# Patient Record
Sex: Male | Born: 1960 | Race: Black or African American | Hispanic: No | Marital: Single | Smoking: Never smoker
Health system: Southern US, Community
[De-identification: ages and names within clinical notes are randomized; demographics above are authoritative.]

## PROBLEM LIST (undated history)

## (undated) DIAGNOSIS — M1990 Unspecified osteoarthritis, unspecified site: Secondary | ICD-10-CM

## (undated) DIAGNOSIS — K219 Gastro-esophageal reflux disease without esophagitis: Secondary | ICD-10-CM

## (undated) DIAGNOSIS — N200 Calculus of kidney: Secondary | ICD-10-CM

## (undated) DIAGNOSIS — M419 Scoliosis, unspecified: Secondary | ICD-10-CM

## (undated) DIAGNOSIS — G709 Myoneural disorder, unspecified: Secondary | ICD-10-CM

## (undated) HISTORY — DX: Myoneural disorder, unspecified: G70.9

## (undated) HISTORY — PX: COLONOSCOPY: SHX174

## (undated) HISTORY — DX: Scoliosis, unspecified: M41.9

## (undated) HISTORY — DX: Calculus of kidney: N20.0

## (undated) HISTORY — DX: Unspecified osteoarthritis, unspecified site: M19.90

---

## 1998-06-23 ENCOUNTER — Emergency Department (HOSPITAL_COMMUNITY): Admission: EM | Admit: 1998-06-23 | Discharge: 1998-06-23 | Payer: Self-pay | Admitting: Emergency Medicine

## 2016-01-08 ENCOUNTER — Encounter (HOSPITAL_COMMUNITY): Payer: Self-pay | Admitting: Emergency Medicine

## 2016-01-08 ENCOUNTER — Emergency Department (HOSPITAL_COMMUNITY)
Admission: EM | Admit: 2016-01-08 | Discharge: 2016-01-09 | Disposition: A | Discharge status: Home / Self Care | Payer: Non-veteran care | Attending: Emergency Medicine | Admitting: Emergency Medicine

## 2016-01-08 DIAGNOSIS — R112 Nausea with vomiting, unspecified: Secondary | ICD-10-CM | POA: Diagnosis not present

## 2016-01-08 DIAGNOSIS — Z79899 Other long term (current) drug therapy: Secondary | ICD-10-CM | POA: Diagnosis not present

## 2016-01-08 DIAGNOSIS — R531 Weakness: Secondary | ICD-10-CM | POA: Insufficient documentation

## 2016-01-08 DIAGNOSIS — R509 Fever, unspecified: Secondary | ICD-10-CM | POA: Diagnosis not present

## 2016-01-08 DIAGNOSIS — R1013 Epigastric pain: Secondary | ICD-10-CM | POA: Diagnosis present

## 2016-01-08 DIAGNOSIS — R05 Cough: Secondary | ICD-10-CM | POA: Insufficient documentation

## 2016-01-08 HISTORY — DX: Gastro-esophageal reflux disease without esophagitis: K21.9

## 2016-01-08 LAB — LIPASE, BLOOD: Lipase: 29 U/L (ref 11–51)

## 2016-01-08 LAB — COMPREHENSIVE METABOLIC PANEL
ALBUMIN: 4.9 g/dL (ref 3.5–5.0)
ALK PHOS: 85 U/L (ref 38–126)
ALT: 18 U/L (ref 17–63)
AST: 25 U/L (ref 15–41)
Anion gap: 11 (ref 5–15)
BILIRUBIN TOTAL: 1.6 mg/dL — AB (ref 0.3–1.2)
BUN: 28 mg/dL — AB (ref 6–20)
CO2: 25 mmol/L (ref 22–32)
CREATININE: 1.44 mg/dL — AB (ref 0.61–1.24)
Calcium: 10.3 mg/dL (ref 8.9–10.3)
Chloride: 106 mmol/L (ref 101–111)
GFR calc Af Amer: >60 mL/min (ref >60)
GFR, EST NON AFRICAN AMERICAN: 54 mL/min — AB (ref >60)
GLUCOSE: 139 mg/dL — AB (ref 65–99)
POTASSIUM: 3.6 mmol/L (ref 3.5–5.1)
Sodium: 142 mmol/L (ref 135–145)
TOTAL PROTEIN: 9.4 g/dL — AB (ref 6.5–8.1)

## 2016-01-08 LAB — CBC
HEMATOCRIT: 45.1 % (ref 39.0–52.0)
Hemoglobin: 15.4 g/dL (ref 13.0–17.0)
MCH: 29.6 pg (ref 26.0–34.0)
MCHC: 34.1 g/dL (ref 30.0–36.0)
MCV: 86.6 fL (ref 78.0–100.0)
PLATELETS: 316 10*3/uL (ref 150–400)
RBC: 5.21 MIL/uL (ref 4.22–5.81)
RDW: 14.3 % (ref 11.5–15.5)
WBC: 10.4 10*3/uL (ref 4.0–10.5)

## 2016-01-08 MED ORDER — DIPHENHYDRAMINE HCL 50 MG/ML IJ SOLN
25.0000 mg | Freq: Once | INTRAMUSCULAR | Status: AC
  Administered 2016-01-08: 25 mg via INTRAVENOUS
  Filled 2016-01-08: qty 1

## 2016-01-08 MED ORDER — SODIUM CHLORIDE 0.9 % IV BOLUS (SEPSIS)
1000.0000 mL | Freq: Once | INTRAVENOUS | Status: AC
  Administered 2016-01-08: 1000 mL via INTRAVENOUS

## 2016-01-08 MED ORDER — METOCLOPRAMIDE HCL 5 MG/ML IJ SOLN
10.0000 mg | Freq: Once | INTRAMUSCULAR | Status: AC
  Administered 2016-01-08: 10 mg via INTRAVENOUS
  Filled 2016-01-08: qty 2

## 2016-01-08 NOTE — ED Provider Notes (Signed)
[place] DEPT Provider Note   CSN: [id number] Arrival date & time: 01/08/16  2108  By signing my name below, I, Higinio Plan, attest that this documentation has been prepared under the direction and in the presence of Orpah Greek, MD . Electronically Signed: Higinio Plan, Scribe. 01/08/2016. 11:34 PM.  History   Chief Complaint Chief Complaint  Patient presents with  . Abdominal Pain  . Emesis   The history is provided by the patient. No language interpreter was used.   HPI Comments: Eric Farrell is a 55 y.o. male with PMHx of GERD, who presents to the Emergency Department complaining of gradually worsening, constant, epigastric abdominal pain with associated nausea, vomiting and dry cough that began 2 days ago. Per friend, pt was "shivering" earlier today and complaining of generalized "weakness." Pt is nauseous now in the ED. He reports similar symptoms in the past in which he was diagnosed with GERD. He denies fever, diarrhea and PSHx to his abdomen.    Past Medical History:  Diagnosis Date  . GERD (gastroesophageal reflux disease)    There are no active problems to display for this patient.  History reviewed. No pertinent surgical history.   Home Medications    Prior to Admission medications   Medication Sig Start Date End Date Taking? Authorizing Provider  omeprazole (PRILOSEC) 40 MG capsule Take 40 mg by mouth daily.   Yes Historical Provider, MD    Family History Family History  Problem Relation Age of Onset  . Cancer Father   . Hypertension Other     Social History Social History  Substance Use Topics  . Smoking status: Never Smoker  . Smokeless tobacco: Never Used  . Alcohol use No    Allergies   Review of patient's allergies indicates no known allergies.  Review of Systems Review of Systems  Constitutional: Positive for chills and fever.  Respiratory: Positive for cough.   Gastrointestinal: Positive for abdominal pain, nausea and vomiting.  Negative for diarrhea.  Neurological: Positive for weakness.   Physical Exam Updated Vital Signs BP (!) 134/102 (BP Location: Left Arm)   Pulse 62   Temp 98.5 F (36.9 C) (Oral)   Resp 18   Ht 6' (1.829 m)   Wt 170 lb (77.1 kg)   SpO2 100%   BMI 23.06 kg/m   Physical Exam  Constitutional: He is oriented to person, place, and time. He appears well-developed and well-nourished. No distress.  HENT:  Head: Normocephalic and atraumatic.  Right Ear: Hearing normal.  Left Ear: Hearing normal.  Nose: Nose normal.  Mouth/Throat: Oropharynx is clear and moist and mucous membranes are normal.  Mucous membranes dry  Eyes: Conjunctivae and EOM are normal. Pupils are equal, round, and reactive to light.  Neck: Normal range of motion. Neck supple.  Cardiovascular: Regular rhythm, S1 normal and S2 normal.  Exam reveals no gallop and no friction rub.   No murmur heard. Pulmonary/Chest: Effort normal and breath sounds normal. No respiratory distress. He exhibits no tenderness.  Abdominal: Soft. Normal appearance and bowel sounds are normal. There is no hepatosplenomegaly. There is tenderness. There is no rebound, no guarding, no tenderness at McBurney's point and negative Murphy's sign. No hernia.  Epigastric tenderness without guarding or rebound   Musculoskeletal: Normal range of motion.  Neurological: He is alert and oriented to person, place, and time. He has normal strength. No cranial nerve deficit or sensory deficit. Coordination normal. GCS eye subscore is 4. GCS verbal subscore is 5. GCS  motor subscore is 6.  Skin: Skin is warm, dry and intact. No rash noted. No cyanosis.  Psychiatric: He has a normal mood and affect. His speech is normal and behavior is normal. Thought content normal.  Nursing note and vitals reviewed.  ED Treatments / Results  Labs (all labs ordered are listed, but only abnormal results are displayed) Labs Reviewed  CBC  LIPASE, BLOOD  COMPREHENSIVE METABOLIC  PANEL  URINALYSIS, ROUTINE W REFLEX MICROSCOPIC (NOT AT [facility])    EKG  EKG Interpretation None       Radiology No results found.  Procedures Procedures  DIAGNOSTIC STUDIES:  Oxygen Saturation is 100% on RA, normal by my interpretation.    COORDINATION OF CARE:  11:19 PM Discussed treatment plan with pt at bedside and pt agreed to plan.  Medications Ordered in ED Medications - No data to display  Initial Impression / Assessment and Plan / ED Course  I have reviewed the triage vital signs and the nursing notes.  Pertinent labs & imaging results that were available during my care of the patient were reviewed by me and considered in my medical decision making (see chart for details).  Clinical Course    Patient presents to the ER for evaluation of nausea and vomiting with upper abdominal pain. Symptoms ongoing for 2 days. He has had some common, cough. Lungs are clear. Patient did appear clinically very dehydrated on arrival. He was treated with antiemetics and IV fluids. He is feeling better after aggressive fluid hydration. Abdominal exam is benign, lab work otherwise unremarkable.  I personally performed the services described in this documentation, which was scribed in my presence. The recorded information has been reviewed and is accurate.   Final Clinical Impressions(s) / ED Diagnoses   Final diagnoses:  None  vomiting abd pain  New Prescriptions New Prescriptions   No medications on file     Orpah Greek, MD 01/09/16 [phone]

## 2016-01-08 NOTE — ED Triage Notes (Signed)
Pt states he has abd pain with nausea and vomiting for 2 days  Pt states he feels weak and dehydrated

## 2016-01-09 LAB — URINALYSIS, ROUTINE W REFLEX MICROSCOPIC
GLUCOSE, UA: NEGATIVE mg/dL
HGB URINE DIPSTICK: NEGATIVE
KETONES UR: 15 mg/dL — AB
Leukocytes, UA: NEGATIVE
Nitrite: NEGATIVE
PROTEIN: 30 mg/dL — AB
Specific Gravity, Urine: 1.034 — ABNORMAL HIGH (ref 1.005–1.030)
pH: 5 (ref 5.0–8.0)

## 2016-01-09 LAB — URINE MICROSCOPIC-ADD ON
RBC / HPF: NONE SEEN RBC/hpf (ref 0–5)
Squamous Epithelial / LPF: NONE SEEN

## 2016-01-09 MED ORDER — PROMETHAZINE HCL 25 MG PO TABS: 25.0000 mg | ORAL_TABLET | Freq: Four times a day (QID) | ORAL | 0 refills | Status: DC | PRN

## 2016-01-09 MED ORDER — SODIUM CHLORIDE 0.9 % IV BOLUS (SEPSIS)
1000.0000 mL | Freq: Once | INTRAVENOUS | Status: AC
  Administered 2016-01-09: 1000 mL via INTRAVENOUS

## 2017-04-05 ENCOUNTER — Encounter (HOSPITAL_COMMUNITY): Payer: Self-pay | Admitting: Emergency Medicine

## 2017-04-05 ENCOUNTER — Other Ambulatory Visit: Payer: Self-pay

## 2017-04-05 ENCOUNTER — Emergency Department (HOSPITAL_COMMUNITY)
Admission: EM | Admit: 2017-04-05 | Discharge: 2017-04-05 | Disposition: A | Discharge status: Home / Self Care | Payer: Non-veteran care | Attending: Emergency Medicine | Admitting: Emergency Medicine

## 2017-04-05 DIAGNOSIS — R112 Nausea with vomiting, unspecified: Secondary | ICD-10-CM | POA: Diagnosis present

## 2017-04-05 LAB — CBC
HCT: 44.6 % (ref 39.0–52.0)
Hemoglobin: 15.1 g/dL (ref 13.0–17.0)
MCH: 29.5 pg (ref 26.0–34.0)
MCHC: 33.9 g/dL (ref 30.0–36.0)
MCV: 87.1 fL (ref 78.0–100.0)
Platelets: 270 10*3/uL (ref 150–400)
RBC: 5.12 MIL/uL (ref 4.22–5.81)
RDW: 15.1 % (ref 11.5–15.5)
WBC: 10.2 10*3/uL (ref 4.0–10.5)

## 2017-04-05 LAB — COMPREHENSIVE METABOLIC PANEL WITH GFR
ALT: 28 U/L (ref 17–63)
AST: 33 U/L (ref 15–41)
Albumin: 3.5 g/dL (ref 3.5–5.0)
Alkaline Phosphatase: 57 U/L (ref 38–126)
Anion gap: 9 (ref 5–15)
BUN: 17 mg/dL (ref 6–20)
CO2: 27 mmol/L (ref 22–32)
Calcium: 9 mg/dL (ref 8.9–10.3)
Chloride: 106 mmol/L (ref 101–111)
Creatinine, Ser: 1.27 mg/dL — ABNORMAL HIGH (ref 0.61–1.24)
GFR calc Af Amer: >60 mL/min
GFR calc non Af Amer: >60 mL/min
Glucose, Bld: 129 mg/dL — ABNORMAL HIGH (ref 65–99)
Potassium: 3.5 mmol/L (ref 3.5–5.1)
Sodium: 142 mmol/L (ref 135–145)
Total Bilirubin: 1.7 mg/dL — ABNORMAL HIGH (ref 0.3–1.2)
Total Protein: 6.7 g/dL (ref 6.5–8.1)

## 2017-04-05 LAB — LIPASE, BLOOD: Lipase: 20 U/L (ref 11–51)

## 2017-04-05 MED ORDER — METOCLOPRAMIDE HCL 5 MG/ML IJ SOLN
10.0000 mg | Freq: Once | INTRAMUSCULAR | Status: AC
  Administered 2017-04-05: 10 mg via INTRAVENOUS
  Filled 2017-04-05: qty 2

## 2017-04-05 MED ORDER — GI COCKTAIL ~~LOC~~
30.0000 mL | Freq: Once | ORAL | Status: AC
  Administered 2017-04-05: 30 mL via ORAL
  Filled 2017-04-05: qty 30

## 2017-04-05 MED ORDER — SODIUM CHLORIDE 0.9 % IV BOLUS (SEPSIS)
1000.0000 mL | Freq: Once | INTRAVENOUS | Status: AC
  Administered 2017-04-05: 1000 mL via INTRAVENOUS

## 2017-04-05 MED ORDER — ONDANSETRON 4 MG PO TBDP
4.0000 mg | ORAL_TABLET | Freq: Once | ORAL | Status: AC | PRN
  Administered 2017-04-05: 4 mg via ORAL
  Filled 2017-04-05: qty 1

## 2017-04-05 MED ORDER — PANTOPRAZOLE SODIUM 40 MG IV SOLR
40.0000 mg | Freq: Once | INTRAVENOUS | Status: AC
  Administered 2017-04-05: 40 mg via INTRAVENOUS
  Filled 2017-04-05: qty 40

## 2017-04-05 MED ORDER — DIPHENHYDRAMINE HCL 50 MG/ML IJ SOLN
25.0000 mg | Freq: Once | INTRAMUSCULAR | Status: AC
  Administered 2017-04-05: 25 mg via INTRAVENOUS
  Filled 2017-04-05: qty 1

## 2017-04-05 NOTE — ED Notes (Signed)
Pt present alert and oriented  x 4 and is verbally responsive. Pt reports that he has been having nausea and vomiting x 2 days ,and states that he has coffee like grounds emesis. Pt also reports burning sensation in his esophagus described as like reflux. Pt does reports that he has associated generalized weakness.

## 2017-04-05 NOTE — ED Triage Notes (Signed)
Patient c/o abd pain "like acid reflux real bad" with n/v x 2 days. Reports that he had this happen before and had to get IV fluids.

## 2017-04-05 NOTE — ED Notes (Signed)
Patient is aware of needing a urine sample. Provided a urinal.

## 2017-04-05 NOTE — ED Provider Notes (Signed)
[place] DEPT Provider Note   CSN: [id number] Arrival date & time: 04/05/17  1500     History   Chief Complaint Chief Complaint  Patient presents with  . Emesis    HPI Eric Farrell is a 56 y.o. male.  The history is provided by the patient. No language interpreter was used.  Emesis     Eric Farrell is a 56 y.o. male who presents to the Emergency Department complaining of N/V.  He reports 2 days of nausea and vomiting.  He initially was vomiting food and today appears to be coffee grounds.  He reports generalized abdominal soreness that started today after multiple episodes of emesis.  He is throwing up 5-6 times daily.  No fevers, cough, chest pain, diarrhea.  He is supposed to be on medications for his stomach but is not sure what they are.  He is followed by the [facility] with gastroenterology.  He has a history of similar symptoms in the past.  He denies any alcohol use but does use marijuana occasionally.  Symptoms are moderate, constant, worsening. Past Medical History:  Diagnosis Date  . GERD (gastroesophageal reflux disease)     There are no active problems to display for this patient.   History reviewed. No pertinent surgical history.     Home Medications    Prior to Admission medications   Medication Sig Start Date End Date Taking? Authorizing Provider  promethazine (PHENERGAN) 25 MG tablet Take 1 tablet (25 mg total) by mouth every 6 (six) hours as needed for nausea or vomiting. 01/09/16  Yes Pollina, Gwenyth Allegra, MD    Family History Family History  Problem Relation Age of Onset  . Cancer Father   . Hypertension Other     Social History Social History   Tobacco Use  . Smoking status: Never Smoker  . Smokeless tobacco: Never Used  Substance Use Topics  . Alcohol use: No  . Drug use: No     Allergies   Patient has no known allergies.   Review of Systems Review of Systems  Gastrointestinal: Positive for  vomiting.  All other systems reviewed and are negative.    Physical Exam Updated Vital Signs BP 114/79 (BP Location: Left Arm)   Pulse 63   Temp 98.9 F (37.2 C) (Oral)   Resp 18   Ht 6' (1.829 m)   Wt 68.5 kg (151 lb)   SpO2 97%   BMI 20.48 kg/m   Physical Exam  Constitutional: He is oriented to person, place, and time. He appears well-developed and well-nourished.  HENT:  Head: Normocephalic and atraumatic.  Cardiovascular: Normal rate and regular rhythm.  No murmur heard. Pulmonary/Chest: Effort normal and breath sounds normal. No respiratory distress.  Abdominal: Soft. There is no rebound and no guarding.  Mild generalized abdominal tenderness  Musculoskeletal: He exhibits no edema or tenderness.  Neurological: He is alert and oriented to person, place, and time.  Skin: Skin is warm and dry.  Psychiatric: He has a normal mood and affect. His behavior is normal.  Nursing note and vitals reviewed.    ED Treatments / Results  Labs (all labs ordered are listed, but only abnormal results are displayed) Labs Reviewed  COMPREHENSIVE METABOLIC PANEL - Abnormal; Notable for the following components:      Result Value   Glucose, Bld 129 (*)    Creatinine, Ser 1.27 (*)    Total Bilirubin 1.7 (*)    All other components within normal  limits  LIPASE, BLOOD  CBC  URINALYSIS, ROUTINE W REFLEX MICROSCOPIC    EKG  EKG Interpretation None       Radiology No results found.  Procedures Procedures (including critical care time)  Medications Ordered in ED Medications  ondansetron (ZOFRAN-ODT) disintegrating tablet 4 mg (4 mg Oral Given 04/05/17 1518)  sodium chloride 0.9 % bolus 1,000 mL (0 mLs Intravenous Stopped 04/05/17 2036)  pantoprazole (PROTONIX) injection 40 mg (40 mg Intravenous Given 04/05/17 1949)  metoCLOPramide (REGLAN) injection 10 mg (10 mg Intravenous Given 04/05/17 1949)  diphenhydrAMINE (BENADRYL) injection 25 mg (25 mg Intravenous Given 04/05/17  1948)  sodium chloride 0.9 % bolus 1,000 mL (0 mLs Intravenous Stopped 04/05/17 2037)  gi cocktail (Maalox,Lidocaine,Donnatal) (30 mLs Oral Given 04/05/17 2055)     Initial Impression / Assessment and Plan / ED Course  I have reviewed the triage vital signs and the nursing notes.  Pertinent labs & imaging results that were available during my care of the patient were reviewed by me and considered in my medical decision making (see chart for details).     Patient here for evaluation of nausea and vomiting for the last 2 days.  He does report coffee-ground emesis today.  He was treated with IV fluids in the department and antiemetics.  On repeat assessment his symptoms are significantly improved and repeat abdominal examination is soft and nontender.  He has a history of multiple similar symptoms in the past and is followed by gastroenterology at the [facility].  He does have GI medications at home but he is unsure of the names.  There is no current evidence of major GI bleed, perforated viscus, bowel obstruction, acute abdomen.  Plan to DC home with outpatient follow-up as well as return precautions.  Final Clinical Impressions(s) / ED Diagnoses   Final diagnoses:  Non-intractable vomiting with nausea, unspecified vomiting type    ED Discharge Orders    None       Quintella Reichert, MD 04/05/17 2353

## 2017-04-07 ENCOUNTER — Encounter (HOSPITAL_COMMUNITY): Payer: Self-pay | Admitting: Emergency Medicine

## 2017-04-07 ENCOUNTER — Emergency Department (HOSPITAL_COMMUNITY)
Admission: EM | Admit: 2017-04-07 | Discharge: 2017-04-07 | Disposition: A | Discharge status: Home / Self Care | Payer: Self-pay | Attending: Emergency Medicine | Admitting: Emergency Medicine

## 2017-04-07 ENCOUNTER — Emergency Department (HOSPITAL_COMMUNITY): Payer: Self-pay

## 2017-04-07 DIAGNOSIS — R1013 Epigastric pain: Secondary | ICD-10-CM | POA: Insufficient documentation

## 2017-04-07 DIAGNOSIS — R112 Nausea with vomiting, unspecified: Secondary | ICD-10-CM | POA: Insufficient documentation

## 2017-04-07 LAB — COMPREHENSIVE METABOLIC PANEL
ALBUMIN: 4 g/dL (ref 3.5–5.0)
ALT: 25 U/L (ref 17–63)
ANION GAP: 7 (ref 5–15)
AST: 26 U/L (ref 15–41)
Alkaline Phosphatase: 59 U/L (ref 38–126)
BILIRUBIN TOTAL: 2.2 mg/dL — AB (ref 0.3–1.2)
BUN: 19 mg/dL (ref 6–20)
CO2: 28 mmol/L (ref 22–32)
Calcium: 9 mg/dL (ref 8.9–10.3)
Chloride: 107 mmol/L (ref 101–111)
Creatinine, Ser: 1.38 mg/dL — ABNORMAL HIGH (ref 0.61–1.24)
GFR calc Af Amer: >60 mL/min (ref >60)
GFR, EST NON AFRICAN AMERICAN: 57 mL/min — AB (ref >60)
GLUCOSE: 121 mg/dL — AB (ref 65–99)
POTASSIUM: 3.2 mmol/L — AB (ref 3.5–5.1)
Sodium: 142 mmol/L (ref 135–145)
TOTAL PROTEIN: 7.2 g/dL (ref 6.5–8.1)

## 2017-04-07 LAB — CBC
HCT: 43 % (ref 39.0–52.0)
Hemoglobin: 14.8 g/dL (ref 13.0–17.0)
MCH: 30.3 pg (ref 26.0–34.0)
MCHC: 34.4 g/dL (ref 30.0–36.0)
MCV: 88.1 fL (ref 78.0–100.0)
PLATELETS: 274 10*3/uL (ref 150–400)
RBC: 4.88 MIL/uL (ref 4.22–5.81)
RDW: 15 % (ref 11.5–15.5)
WBC: 9.7 10*3/uL (ref 4.0–10.5)

## 2017-04-07 LAB — URINALYSIS, ROUTINE W REFLEX MICROSCOPIC
BILIRUBIN URINE: NEGATIVE
Glucose, UA: NEGATIVE mg/dL
Ketones, ur: 20 mg/dL — AB
LEUKOCYTES UA: NEGATIVE
Nitrite: NEGATIVE
PH: 7 (ref 5.0–8.0)
Protein, ur: NEGATIVE mg/dL
SPECIFIC GRAVITY, URINE: 1.02 (ref 1.005–1.030)

## 2017-04-07 LAB — I-STAT TROPONIN, ED: Troponin i, poc: 0 ng/mL (ref 0.00–0.08)

## 2017-04-07 LAB — LIPASE, BLOOD: LIPASE: 21 U/L (ref 11–51)

## 2017-04-07 MED ORDER — GI COCKTAIL ~~LOC~~
30.0000 mL | Freq: Once | ORAL | Status: AC
  Administered 2017-04-07: 30 mL via ORAL
  Filled 2017-04-07: qty 30

## 2017-04-07 MED ORDER — METOCLOPRAMIDE HCL 5 MG/ML IJ SOLN
10.0000 mg | Freq: Once | INTRAMUSCULAR | Status: AC
  Administered 2017-04-07: 10 mg via INTRAVENOUS
  Filled 2017-04-07: qty 2

## 2017-04-07 MED ORDER — RANITIDINE HCL 150 MG PO TABS: 150.0000 mg | ORAL_TABLET | Freq: Two times a day (BID) | ORAL | 0 refills | Status: DC

## 2017-04-07 MED ORDER — SODIUM CHLORIDE 0.9 % IV BOLUS (SEPSIS)
1000.0000 mL | Freq: Once | INTRAVENOUS | Status: AC
  Administered 2017-04-07: 1000 mL via INTRAVENOUS

## 2017-04-07 MED ORDER — LORAZEPAM 2 MG/ML IJ SOLN
1.0000 mg | Freq: Once | INTRAMUSCULAR | Status: DC
  Filled 2017-04-07: qty 1

## 2017-04-07 MED ORDER — POTASSIUM CHLORIDE CRYS ER 20 MEQ PO TBCR
60.0000 meq | EXTENDED_RELEASE_TABLET | Freq: Once | ORAL | Status: AC
  Administered 2017-04-07: 60 meq via ORAL
  Filled 2017-04-07: qty 3

## 2017-04-07 MED ORDER — DIPHENHYDRAMINE HCL 50 MG/ML IJ SOLN
25.0000 mg | Freq: Once | INTRAMUSCULAR | Status: AC
  Administered 2017-04-07: 25 mg via INTRAVENOUS
  Filled 2017-04-07: qty 1

## 2017-04-07 MED ORDER — METOCLOPRAMIDE HCL 5 MG PO TABS: 5.0000 mg | ORAL_TABLET | Freq: Four times a day (QID) | ORAL | 0 refills | Status: DC | PRN

## 2017-04-07 NOTE — ED Provider Notes (Signed)
[place] DEPT Provider Note   CSN: [id number] Arrival date & time: 04/07/17  1026     History   Chief Complaint Chief Complaint  Patient presents with  . Abdominal Pain  . Shortness of Breath    HPI Eric Farrell is a 56 y.o. male who presents with nausea/vomiting and diffuse epigastric abdominal pain that began this morning.  Patient reports that he was seen in the ED 2 days ago for the same symptoms.  He had fluids and meds at that time and had improvement in symptoms and was discharged home.  Patient reports that he had been doing better until today when symptoms began again.  He is nt taking any medication for the pain.  He reports that he has had several episodes of nonbloody, nonbilious vomiting since onset. He also reports some intermittent indigestion. Patient reports that abdominal pain is in the epigastric region and describes it as an aching sensation. He denise any alleviating or aggravating factors.  He reports subjective fever but states he has not measured his temperature.  He also reports that he has had an intermittent cough.  He reports that he has had some abdominal soreness and some difficulty breathing whenever he is coughing fit but denies any difficulty breathing at rest.  Patient denies any chest pain, difficulty breathing, dysuria, hematuria.  Patient does not have any history of abdominal surgeries.  The history is provided by the patient.    Past Medical History:  Diagnosis Date  . GERD (gastroesophageal reflux disease)     There are no active problems to display for this patient.   History reviewed. No pertinent surgical history.     Home Medications    Prior to Admission medications   Medication Sig Start Date End Date Taking? Authorizing Provider  metoCLOPramide (REGLAN) 5 MG tablet Take 1 tablet (5 mg total) by mouth every 6 (six) hours as needed for nausea or vomiting. 04/07/17   Law, Bea Graff, PA-C    promethazine (PHENERGAN) 25 MG tablet Take 1 tablet (25 mg total) by mouth every 6 (six) hours as needed for nausea or vomiting. 01/09/16   Pollina, Gwenyth Allegra, MD  ranitidine (ZANTAC) 150 MG tablet Take 1 tablet (150 mg total) by mouth 2 (two) times daily. 04/07/17   Frederica Kuster, PA-C    Family History Family History  Problem Relation Age of Onset  . Cancer Father   . Hypertension Other     Social History Social History   Tobacco Use  . Smoking status: Never Smoker  . Smokeless tobacco: Never Used  Substance Use Topics  . Alcohol use: No  . Drug use: No     Allergies   Patient has no known allergies.   Review of Systems Review of Systems  Constitutional: Positive for fever (subjective).  Respiratory: Negative for cough and shortness of breath.   Cardiovascular: Negative for chest pain.  Gastrointestinal: Positive for abdominal pain, nausea and vomiting. Negative for diarrhea.  Genitourinary: Negative for dysuria and hematuria.  Musculoskeletal: Negative for neck pain.  Neurological: Negative for weakness.  Psychiatric/Behavioral: Negative for confusion.     Physical Exam Updated Vital Signs BP (!) 159/102   Pulse (!) 51   Temp 98 F (36.7 C) (Oral)   Resp (!) 21   SpO2 99%   Physical Exam  Constitutional: He is oriented to person, place, and time. He appears well-developed and well-nourished.  Appears uncomfortable but no acute distress   HENT:  Head: Normocephalic and atraumatic.  Mouth/Throat: Oropharynx is clear and moist. Mucous membranes are dry.  Eyes: Conjunctivae, EOM and lids are normal. Pupils are equal, round, and reactive to light.  Neck: Full passive range of motion without pain.  Cardiovascular: Normal rate, regular rhythm, normal heart sounds and normal pulses. Exam reveals no gallop and no friction rub.  No murmur heard. Pulmonary/Chest: Effort normal and breath sounds normal.  No evidence of respiratory distress. Able to speak in  full sentences without difficulty.  Abdominal: Soft. Normal appearance. There is tenderness in the epigastric area. There is no rigidity, no guarding, no CVA tenderness, no tenderness at McBurney's point and negative Murphy's sign.  Tenderness palpation to the epigastric region.  No Murphy's sign.  No McBurney's point tenderness.  No rigidity, guarding.  No peritoneal signs.  Musculoskeletal: Normal range of motion.  Neurological: He is alert and oriented to person, place, and time.  Skin: Skin is warm and dry. Capillary refill takes less than 2 seconds.  Psychiatric: He has a normal mood and affect. His speech is normal.  Nursing note and vitals reviewed.    ED Treatments / Results  Labs (all labs ordered are listed, but only abnormal results are displayed) Labs Reviewed  COMPREHENSIVE METABOLIC PANEL - Abnormal; Notable for the following components:      Result Value   Potassium 3.2 (*)    Glucose, Bld 121 (*)    Creatinine, Ser 1.38 (*)    Total Bilirubin 2.2 (*)    GFR calc non Af Amer 57 (*)    All other components within normal limits  URINALYSIS, ROUTINE W REFLEX MICROSCOPIC - Abnormal; Notable for the following components:   APPearance HAZY (*)    Hgb urine dipstick SMALL (*)    Ketones, ur 20 (*)    Bacteria, UA RARE (*)    Squamous Epithelial / LPF 0-5 (*)    All other components within normal limits  LIPASE, BLOOD  CBC  I-STAT TROPONIN, ED    EKG  EKG Interpretation  Date/Time:  Thursday April 07 2017 11:01:42 EST Ventricular Rate:  53 PR Interval:    QRS Duration: 90 QT Interval:  565 QTC Calculation: 531 R Axis:   80 Text Interpretation:  Sinus rhythm Nonspecific T abnormalities, lateral leads Prolonged QT interval Baseline wander in lead(s) III aVL aVF V5 No previous tracing Confirmed by Blanchie Dessert [phone]) on 04/07/2017 1:26:14 PM       Radiology US Abdomen Limited  Result Date: 04/07/2017 CLINICAL DATA:  Right upper quadrant pain. EXAM:  ULTRASOUND ABDOMEN LIMITED RIGHT UPPER QUADRANT COMPARISON:  None. FINDINGS: Gallbladder: No gallstones or wall thickening visualized. No sonographic Murphy sign noted by sonographer. Common bile duct: Diameter: 2.1 mm Liver: Anechoic anterior left hepatic lobe cyst measuring 3.2 x 2.2 x 2.7 cm is noted. Liver parenchyma is otherwise unremarkable. Portal vein is patent on color Doppler imaging with normal direction of blood flow towards the liver. IMPRESSION: 1. No acute abnormality. 2. 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 Electronically Signed   By: Ashley Royalty M.D.   On: 04/07/2017 16:17    Procedures Procedures (including critical care time)  Medications Ordered in ED Medications  sodium chloride 0.9 % bolus 1,000 mL (0 mLs Intravenous Stopped 04/07/17 1449)  gi cocktail (Maalox,Lidocaine,Donnatal) (30 mLs Oral Given 04/07/17 1442)  metoCLOPramide (REGLAN) injection 10 mg (10 mg Intravenous Given 04/07/17 1448)  diphenhydrAMINE (BENADRYL) injection 25 mg (25 mg Intravenous  Given 04/07/17 1442)  potassium chloride SA (K-DUR,KLOR-CON) CR tablet 60 mEq (60 mEq Oral Given 04/07/17 1643)     Initial Impression / Assessment and Plan / ED Course  I have reviewed the triage vital signs and the nursing notes.  Pertinent labs & imaging results that were available during my care of the patient were reviewed by me and considered in my medical decision making (see chart for details).     56 year old male who presents with nausea/vomiting and abdominal pain that began today.  Seen in the ED 2 days ago for similar symptoms.  Had fluids medications at the time and was able to tolerate p.o. and was discharged.  Had been improving but reports that symptoms returned today. Patient is afebrile, non-toxic appearing, sitting comfortably on examination table. Vital signs reviewed and stable.  Physical exam shows tenderness palpation of the epigastric region.  No McBurney's point tenderness.   No Murphy sign.  Consider acute infectious etiology vs GERD.  History/physical exam not concerning for appendicitis, diverticulitis, SBO.  Low suspicion for ACS etiology.  Initial labs ordered at triage, including UA, troponin, CMP, lipase, CBC. IVF given for fluid resuscitation. Analgesics provided in the department.  Labs and imaging reviewed.  Troponin is negative.  CMP shows slight hypokalemia.  Creatinine shows a slight elevation from previous labs.  It went from 1.24-1.38.  BUN is otherwise stable.  Total bili is elevated at 2.2, is elevated from previous one at 1.7.  Lipase unremarkable.  CBC unremarkable.  Chest x-ray negative for any acute infectious etiology.  There is mention of a nodule noted to the left upper lung that will need further CT evaluation for the time.  Given history/physical exam, will obtain right upper quadrant ultrasound for evaluation of cholecystitis.  Patient signed out to Eliezer Mccoy, PA-C with RUQ pending. Anticipate discharge if ultrasound is unremarkable and patient able to tolerate PO. Please see her note for further detail.   Final Clinical Impressions(s) / ED Diagnoses   Final diagnoses:  Epigastric pain  Non-intractable vomiting with nausea, unspecified vomiting type    ED Discharge Orders        Ordered    metoCLOPramide (REGLAN) 5 MG tablet  Every 6 hours PRN     04/07/17 1739    ranitidine (ZANTAC) 150 MG tablet  2 times daily     04/07/17 1739       Desma Mcgregor 04/09/17 1146    Blanchie Dessert, MD 04/10/17 2028

## 2017-04-07 NOTE — ED Notes (Signed)
Pt. Made aware we need urine. Urinal at bedside.

## 2017-04-07 NOTE — ED Notes (Signed)
Pt states unable to provide urine specimen until he receives an IV

## 2017-04-07 NOTE — Discharge Instructions (Signed)
Please follow-up with your doctor and GI doctor for further evaluation and treatment.  A lung nodule was found on your chest x-ray and it is recommended that you get a noncontrast chest CT as soon as possible for further evaluation of this nodule (1.4 x 1.1 cm nodular opacity left apex).  A hepatic cyst was also found on your liver (3.2 x 2.2 x 2.7 cm cyst in the anterior left hepatic lobe)- please make your GI doctor aware of this.  Take Reglan every 6 hours only as needed for nausea or vomiting.  Take Zantac twice daily for potential acid reflux or ulcer causing her symptoms.  Please return to emergency department if you develop any new or worsening symptoms.

## 2017-04-07 NOTE — ED Triage Notes (Signed)
Pt reports abd pain, emesis and SOB for the past 2 days. Was seen here for same 2 days ago, felt better and was discharged. Pt reports symptoms returned after eating breakfast this am.

## 2017-04-07 NOTE — ED Provider Notes (Signed)
Sign out from Providence Lanius, PA-C at shift change  See previous provider's note for full H&P. Briefly, patient presenting with epigastric pain, nonradiating. N/V NBNB. He has had some indigestion, but no chest pain or shortness of breath.  RUQ ultrasound is pending to eval of gallstones. Surgery follow up if necessary.  Plan to follow up with GI outpatient if negative. Potassium replenished in the ED (3.2).  RUQ ultrasound shows no acute abnormality, but a hepatic cyst in the left anterior lobe. I made patient aware of this incidental finding and to follow up with GI about this. I will discharge patient home with short course Reglan and ranitidine considering patient's prolonged QT.  Patient also made aware of the 1.4 x 1.1 cm nodular opacity in the left apex found on chest x-ray.  Patient is advised to follow-up with PCP for noncontrast chest CT for further evaluation.  Patient tolerating oral fluids prior to discharge.  Patient given strict return precautions.  Follow-up to GI and PCP as discussed above.  Patient understands and agrees with plan.  Patient vitals stable and discharged in satisfactory condition.       Frederica Kuster, PA-C 04/07/17 1744    Blanchie Dessert, MD 04/08/17 2156

## 2018-05-08 ENCOUNTER — Encounter (HOSPITAL_COMMUNITY): Payer: Self-pay

## 2018-05-08 ENCOUNTER — Other Ambulatory Visit: Payer: Self-pay

## 2018-05-08 ENCOUNTER — Emergency Department (HOSPITAL_COMMUNITY)
Admission: EM | Admit: 2018-05-08 | Discharge: 2018-05-08 | Disposition: A | Discharge status: Home / Self Care | Payer: Self-pay | Attending: Emergency Medicine | Admitting: Emergency Medicine

## 2018-05-08 ENCOUNTER — Emergency Department (HOSPITAL_COMMUNITY): Payer: Self-pay

## 2018-05-08 DIAGNOSIS — K219 Gastro-esophageal reflux disease without esophagitis: Secondary | ICD-10-CM

## 2018-05-08 DIAGNOSIS — Z79899 Other long term (current) drug therapy: Secondary | ICD-10-CM | POA: Insufficient documentation

## 2018-05-08 DIAGNOSIS — R319 Hematuria, unspecified: Secondary | ICD-10-CM

## 2018-05-08 LAB — URINALYSIS, ROUTINE W REFLEX MICROSCOPIC
BACTERIA UA: NONE SEEN
BILIRUBIN URINE: NEGATIVE
Glucose, UA: NEGATIVE mg/dL
KETONES UR: 5 mg/dL — AB
LEUKOCYTES UA: NEGATIVE
Nitrite: NEGATIVE
PH: 5 (ref 5.0–8.0)
PROTEIN: 30 mg/dL — AB
Specific Gravity, Urine: 1.026 (ref 1.005–1.030)

## 2018-05-08 LAB — COMPREHENSIVE METABOLIC PANEL
ALBUMIN: 4.2 g/dL (ref 3.5–5.0)
ALT: 23 U/L (ref 0–44)
ANION GAP: 10 (ref 5–15)
AST: 26 U/L (ref 15–41)
Alkaline Phosphatase: 58 U/L (ref 38–126)
BUN: 19 mg/dL (ref 6–20)
CHLORIDE: 108 mmol/L (ref 98–111)
CO2: 25 mmol/L (ref 22–32)
Calcium: 9.4 mg/dL (ref 8.9–10.3)
Creatinine, Ser: 1.19 mg/dL (ref 0.61–1.24)
GFR calc non Af Amer: >60 mL/min (ref >60)
GLUCOSE: 156 mg/dL — AB (ref 70–99)
POTASSIUM: 4.2 mmol/L (ref 3.5–5.1)
SODIUM: 143 mmol/L (ref 135–145)
Total Bilirubin: 1.1 mg/dL (ref 0.3–1.2)
Total Protein: 7.2 g/dL (ref 6.5–8.1)

## 2018-05-08 LAB — CBC
HEMATOCRIT: 45.8 % (ref 39.0–52.0)
HEMOGLOBIN: 14.6 g/dL (ref 13.0–17.0)
MCH: 29.3 pg (ref 26.0–34.0)
MCHC: 31.9 g/dL (ref 30.0–36.0)
MCV: 91.8 fL (ref 80.0–100.0)
NRBC: 0 % (ref 0.0–0.2)
PLATELETS: 280 10*3/uL (ref 150–400)
RBC: 4.99 MIL/uL (ref 4.22–5.81)
RDW: 14.6 % (ref 11.5–15.5)
WBC: 9.4 10*3/uL (ref 4.0–10.5)

## 2018-05-08 LAB — LIPASE, BLOOD: LIPASE: 32 U/L (ref 11–51)

## 2018-05-08 MED ORDER — SODIUM CHLORIDE 0.9 % IV BOLUS
1000.0000 mL | Freq: Once | INTRAVENOUS | Status: AC
  Administered 2018-05-08: 1000 mL via INTRAVENOUS

## 2018-05-08 MED ORDER — ONDANSETRON 4 MG PO TBDP
4.0000 mg | ORAL_TABLET | Freq: Once | ORAL | Status: AC
  Administered 2018-05-08: 4 mg via ORAL
  Filled 2018-05-08: qty 1

## 2018-05-08 MED ORDER — ONDANSETRON 4 MG PO TBDP: 4.0000 mg | ORAL_TABLET | Freq: Three times a day (TID) | ORAL | 0 refills | Status: DC | PRN

## 2018-05-08 MED ORDER — ONDANSETRON HCL 4 MG/2ML IJ SOLN
4.0000 mg | Freq: Once | INTRAMUSCULAR | Status: AC
  Administered 2018-05-08: 4 mg via INTRAVENOUS
  Filled 2018-05-08: qty 2

## 2018-05-08 NOTE — Discharge Instructions (Signed)
Take your medications as prescribed by your gastroenterologist. Follow-up with your primary care provider or gastroenterologist.  Return to the ER for new or worsening symptoms.  Follow-up with your primary care doctor for recheck of the blood in your urine today. Take Zofran as needed as prescribed for nausea and vomiting.

## 2018-05-08 NOTE — ED Notes (Signed)
Pt called out stating that he was "getting sick" PA made aware. PA to pt bedside to discuss care

## 2018-05-08 NOTE — ED Provider Notes (Signed)
[facility] DEPT Provider Note   CSN: [id number] Arrival date & time: 05/08/18  1343     History   Chief Complaint Chief Complaint  Patient presents with  . Abdominal Pain  . Emesis  . Diarrhea    HPI Eric Farrell is a 57 y.o. male.  57yo male presents with complaint generalized abdominal pain onset 4 AM, woke him from his sleep.  Patient states he woke up to have a bowel movement, reports normal bowel movement then developed severe aching generalized abdominal pain which has been constant since that time.  Associated with nausea and vomiting also chills.  Patient denies sweats, has not checked his temperature, no known sick contacts.  Reports history of similar episodes previously related to reflux, denies pancreatitis.  Emesis reported be nonbloody, denies blood in his stools.  No previous abdominal surgeries.  No other complaints or concerns.     Past Medical History:  Diagnosis Date  . GERD (gastroesophageal reflux disease)     There are no active problems to display for this patient.   History reviewed. No pertinent surgical history.      Home Medications    Prior to Admission medications   Medication Sig Start Date End Date Taking? Authorizing Provider  pantoprazole (PROTONIX) 20 MG tablet Take 20 mg by mouth daily.   Yes [provider]  sucralfate (CARAFATE) 1 GM/10ML suspension Take 1 g by mouth 4 (four) times daily -  with meals and at bedtime.   Yes [provider]  metoCLOPramide (REGLAN) 5 MG tablet Take 1 tablet (5 mg total) by mouth every 6 (six) hours as needed for nausea or vomiting. Patient not taking: Reported on 05/08/2018 04/07/17   Frederica Kuster, PA-C  ondansetron (ZOFRAN ODT) 4 MG disintegrating tablet Take 1 tablet (4 mg total) by mouth every 8 (eight) hours as needed for nausea or vomiting. 05/08/18   Tacy Learn, PA-C  promethazine (PHENERGAN) 25 MG tablet Take 1 tablet (25 mg total) by  mouth every 6 (six) hours as needed for nausea or vomiting. Patient not taking: Reported on 05/08/2018 01/09/16   Orpah Greek, MD  ranitidine (ZANTAC) 150 MG tablet Take 1 tablet (150 mg total) by mouth 2 (two) times daily. Patient not taking: Reported on 05/08/2018 04/07/17   Frederica Kuster, PA-C    Family History Family History  Problem Relation Age of Onset  . Cancer Father   . Hypertension Other     Social History Social History   Tobacco Use  . Smoking status: Never Smoker  . Smokeless tobacco: Never Used  Substance Use Topics  . Alcohol use: No  . Drug use: No     Allergies   Patient has no known allergies.   Review of Systems Review of Systems  Constitutional: Positive for chills. Negative for diaphoresis and fever.  Respiratory: Negative for shortness of breath.   Cardiovascular: Negative for chest pain.  Gastrointestinal: Positive for abdominal pain, nausea and vomiting. Negative for abdominal distention, blood in stool, constipation and diarrhea.  Genitourinary: Negative for decreased urine volume and difficulty urinating.  Musculoskeletal: Negative for arthralgias, back pain and myalgias.  Skin: Negative for wound.  Allergic/Immunologic: Negative for immunocompromised state.  Neurological: Negative for dizziness and weakness.  Hematological: Does not bruise/bleed easily.  Psychiatric/Behavioral: Negative for confusion.  All other systems reviewed and are negative.    Physical Exam Updated Vital Signs BP (!) 134/93   Pulse 64   Temp 98.5  F (36.9 C) (Oral)   Resp 15   Ht 6' (1.829 m)   Wt 72.6 kg   SpO2 99%   BMI 21.70 kg/m   Physical Exam Vitals signs and nursing note reviewed.  Constitutional:      General: He is not in acute distress.    Appearance: He is well-developed. He is not diaphoretic.  HENT:     Head: Normocephalic and atraumatic.  Cardiovascular:     Rate and Rhythm: Normal rate and regular rhythm.     Heart  sounds: Normal heart sounds. No murmur.  Pulmonary:     Effort: Pulmonary effort is normal.     Breath sounds: Normal breath sounds.  Abdominal:     General: Abdomen is flat.     Palpations: Abdomen is soft.     Tenderness: There is generalized abdominal tenderness. There is no right CVA tenderness, left CVA tenderness, guarding or rebound.  Skin:    General: Skin is warm and dry.     Findings: No rash.  Neurological:     Mental Status: He is alert and oriented to person, place, and time.  Psychiatric:        Behavior: Behavior normal.      ED Treatments / Results  Labs (all labs ordered are listed, but only abnormal results are displayed) Labs Reviewed  COMPREHENSIVE METABOLIC PANEL - Abnormal; Notable for the following components:      Result Value   Glucose, Bld 156 (*)    All other components within normal limits  URINALYSIS, ROUTINE W REFLEX MICROSCOPIC - Abnormal; Notable for the following components:   Hgb urine dipstick LARGE (*)    Ketones, ur 5 (*)    Protein, ur 30 (*)    All other components within normal limits  LIPASE, BLOOD  CBC    EKG None  Radiology Ct Renal Stone Study  Result Date: 05/08/2018 CLINICAL DATA:  Generalized abdominal pain, nausea, vomiting and diarrhea since yesterday. EXAM: CT ABDOMEN AND PELVIS WITHOUT CONTRAST TECHNIQUE: Multidetector CT imaging of the abdomen and pelvis was performed following the standard protocol without IV contrast. COMPARISON:  Abdominal ultrasound 04/07/2017 FINDINGS: Lower chest: Normal size heart without pericardial effusion. Clear lung bases. Hepatobiliary: Well-circumscribed hypodensity in the left hepatic lobe consistent with the previously documented cyst in the anterior left hepatic lobe on ultrasound. Currently this measures 2.3 x 1.8 x 2.4 cm. No biliary dilatation. Gallbladder is unremarkable. Pancreas: Normal Spleen: Normal with adjacent splenule. Adrenals/Urinary Tract: Normal bilateral adrenal glands.  Right upper pole renal calculus measuring 9 x 2 x 6 mm no hydroureteronephrosis. The urinary bladder is unremarkable. Stomach/Bowel: Stomach is within normal limits. Appendix appears normal. No evidence of bowel wall thickening, distention, or inflammatory changes. Vascular/Lymphatic: Aortic atherosclerosis. No enlarged abdominal or pelvic lymph nodes. Reproductive: Unremarkable prostate and seminal vesicles. Other: No abdominal wall hernia or abnormality. No abdominopelvic ascites. Musculoskeletal: Multilevel degenerative disc disease from the included T11 vertebral body caudad through S1 with moderate concentric disc bulge L3-4 and L5-S1 central to left central partially calcified disc-osteophyte complex. IMPRESSION: 1. Right upper pole renal calculus measuring 9 x 2 x 6 mm. No obstructive uropathy. 2. Well-circumscribed hypodensity in the left hepatic lobe measuring 2.3 x 1.8 x 2.4 cm consistent with previously noted cyst by ultrasound. 3. No acute bowel obstruction or inflammation.  Normal appendix. 4. Multilevel degenerative disc disease of the lumbar spine. Electronically Signed   By: Ashley Royalty M.D.   On: 05/08/2018 17:43  Procedures Procedures (including critical care time)  Medications Ordered in ED Medications  sodium chloride 0.9 % bolus 1,000 mL (0 mLs Intravenous Stopped 05/08/18 1712)  ondansetron (ZOFRAN-ODT) disintegrating tablet 4 mg (4 mg Oral Given 05/08/18 1528)  ondansetron (ZOFRAN) injection 4 mg (4 mg Intravenous Given 05/08/18 1711)     Initial Impression / Assessment and Plan / ED Course  I have reviewed the triage vital signs and the nursing notes.  Pertinent labs & imaging results that were available during my care of the patient were reviewed by me and considered in my medical decision making (see chart for details).  Clinical Course as of May 08 1800  Mon May 08, 2348  2970 57 year old male presents with complaint of nausea vomiting and abdominal pain, similar to  previous episodes of GERD.  Patient is followed by GI and has been taking his medications as prescribed.  Patient's lab work with normal CBC, unremarkable CMP, normal lipase.  Urinalysis with hematuria.  Patient has a history of kidney stones unsure if his symptoms today are similar to prior kidney stones.  CT abdomen pelvis without contrast shows right renal stone, other nonacute unchanged findings.  Discussed results with patient, recommend he follow-up with his gastroenterologist as well as his PCP to recheck his urine for his hematuria.  Patient given prescription for Zofran.  He is tolerating p.o. fluids and ready for discharge home at this time.   [LM]    Clinical Course User Index [LM] Tacy Learn, PA-C   Final Clinical Impressions(s) / ED Diagnoses   Final diagnoses:  Gastroesophageal reflux disease, esophagitis presence not specified  Hematuria, unspecified type    ED Discharge Orders         Ordered    ondansetron (ZOFRAN ODT) 4 MG disintegrating tablet  Every 8 hours PRN     05/08/18 1800           Tacy Learn, PA-C 05/08/18 1801    Lacretia Leigh, MD 05/09/18 [phone]

## 2018-05-08 NOTE — ED Triage Notes (Signed)
Patient c/o abdominal pain, N/V/D since yesterday. Patient states he has been seen for the same a few times.

## 2019-10-25 ENCOUNTER — Telehealth: Payer: Self-pay | Admitting: Gastroenterology

## 2019-10-25 NOTE — Telephone Encounter (Signed)
Hi Dr. Bryan Lemma, we have received a referral for you from the [facility] for a repeat colon. Patient had colon in 2018. Records have been received and they will be sent to you for review. Please advise on scheduling. Thank you.

## 2019-10-28 DIAGNOSIS — R112 Nausea with vomiting, unspecified: Secondary | ICD-10-CM | POA: Insufficient documentation

## 2019-10-28 DIAGNOSIS — B3741 Candidal cystitis and urethritis: Secondary | ICD-10-CM | POA: Insufficient documentation

## 2019-10-28 DIAGNOSIS — E860 Dehydration: Secondary | ICD-10-CM | POA: Insufficient documentation

## 2019-10-28 DIAGNOSIS — R05 Cough: Secondary | ICD-10-CM | POA: Diagnosis not present

## 2019-10-29 ENCOUNTER — Emergency Department (HOSPITAL_COMMUNITY)
Admission: EM | Admit: 2019-10-29 | Discharge: 2019-10-29 | Disposition: A | Discharge status: Home / Self Care | Payer: No Typology Code available for payment source | Attending: Emergency Medicine | Admitting: Emergency Medicine

## 2019-10-29 ENCOUNTER — Encounter (HOSPITAL_COMMUNITY): Payer: Self-pay

## 2019-10-29 ENCOUNTER — Emergency Department (HOSPITAL_COMMUNITY): Payer: No Typology Code available for payment source

## 2019-10-29 ENCOUNTER — Other Ambulatory Visit: Payer: Self-pay

## 2019-10-29 DIAGNOSIS — B3749 Other urogenital candidiasis: Secondary | ICD-10-CM

## 2019-10-29 DIAGNOSIS — E860 Dehydration: Secondary | ICD-10-CM

## 2019-10-29 DIAGNOSIS — R112 Nausea with vomiting, unspecified: Secondary | ICD-10-CM

## 2019-10-29 LAB — COMPREHENSIVE METABOLIC PANEL
ALT: 16 U/L (ref 0–44)
AST: 25 U/L (ref 15–41)
Albumin: 4.5 g/dL (ref 3.5–5.0)
Alkaline Phosphatase: 67 U/L (ref 38–126)
Anion gap: 17 — ABNORMAL HIGH (ref 5–15)
BUN: 20 mg/dL (ref 6–20)
CO2: 23 mmol/L (ref 22–32)
Calcium: 9.4 mg/dL (ref 8.9–10.3)
Chloride: 100 mmol/L (ref 98–111)
Creatinine, Ser: 1.39 mg/dL — ABNORMAL HIGH (ref 0.61–1.24)
GFR calc Af Amer: >60 mL/min (ref >60)
GFR calc non Af Amer: 56 mL/min — ABNORMAL LOW (ref >60)
Glucose, Bld: 128 mg/dL — ABNORMAL HIGH (ref 70–99)
Potassium: 3.8 mmol/L (ref 3.5–5.1)
Sodium: 140 mmol/L (ref 135–145)
Total Bilirubin: 1.5 mg/dL — ABNORMAL HIGH (ref 0.3–1.2)
Total Protein: 8.5 g/dL — ABNORMAL HIGH (ref 6.5–8.1)

## 2019-10-29 LAB — URINALYSIS, ROUTINE W REFLEX MICROSCOPIC
Bilirubin Urine: NEGATIVE
Glucose, UA: NEGATIVE mg/dL
Ketones, ur: 20 mg/dL — AB
Leukocytes,Ua: NEGATIVE
Nitrite: NEGATIVE
Protein, ur: 100 mg/dL — AB
Specific Gravity, Urine: 1.031 — ABNORMAL HIGH (ref 1.005–1.030)
pH: 5 (ref 5.0–8.0)

## 2019-10-29 LAB — DIFFERENTIAL
Abs Immature Granulocytes: 0.04 10*3/uL (ref 0.00–0.07)
Basophils Absolute: 0.1 10*3/uL (ref 0.0–0.1)
Basophils Relative: 1 %
Eosinophils Absolute: 0 10*3/uL (ref 0.0–0.5)
Eosinophils Relative: 0 %
Immature Granulocytes: 0 %
Lymphocytes Relative: 26 %
Lymphs Abs: 3.1 10*3/uL (ref 0.7–4.0)
Monocytes Absolute: 1.4 10*3/uL — ABNORMAL HIGH (ref 0.1–1.0)
Monocytes Relative: 11 %
Neutro Abs: 7.5 10*3/uL (ref 1.7–7.7)
Neutrophils Relative %: 62 %

## 2019-10-29 LAB — CBC
HCT: 45.4 % (ref 39.0–52.0)
Hemoglobin: 14.9 g/dL (ref 13.0–17.0)
MCH: 30.2 pg (ref 26.0–34.0)
MCHC: 32.8 g/dL (ref 30.0–36.0)
MCV: 91.9 fL (ref 80.0–100.0)
Platelets: 225 10*3/uL (ref 150–400)
RBC: 4.94 MIL/uL (ref 4.22–5.81)
RDW: 14.4 % (ref 11.5–15.5)
WBC: 11.7 10*3/uL — ABNORMAL HIGH (ref 4.0–10.5)
nRBC: 0 % (ref 0.0–0.2)

## 2019-10-29 LAB — RAPID HIV SCREEN (HIV 1/2 AB+AG)
HIV 1/2 Antibodies: NONREACTIVE
HIV-1 P24 Antigen - HIV24: NONREACTIVE

## 2019-10-29 LAB — LIPASE, BLOOD: Lipase: 25 U/L (ref 11–51)

## 2019-10-29 MED ORDER — METOCLOPRAMIDE HCL 10 MG PO TABS
10.0000 mg | ORAL_TABLET | Freq: Four times a day (QID) | ORAL | 0 refills | Status: DC | PRN
Start: 2019-10-29 — End: 2020-08-15

## 2019-10-29 MED ORDER — ONDANSETRON 4 MG PO TBDP
4.0000 mg | ORAL_TABLET | Freq: Once | ORAL | Status: AC | PRN
  Administered 2019-10-29: 4 mg via ORAL
  Filled 2019-10-29: qty 1

## 2019-10-29 MED ORDER — SODIUM CHLORIDE 0.9 % IV SOLN
1.0000 g | Freq: Once | INTRAVENOUS | Status: AC
  Administered 2019-10-29: 1 g via INTRAVENOUS
  Filled 2019-10-29: qty 10

## 2019-10-29 MED ORDER — SODIUM CHLORIDE 0.9 % IV BOLUS
1000.0000 mL | Freq: Once | INTRAVENOUS | Status: AC
  Administered 2019-10-29: 1000 mL via INTRAVENOUS

## 2019-10-29 MED ORDER — METOCLOPRAMIDE HCL 5 MG/ML IJ SOLN
10.0000 mg | Freq: Once | INTRAMUSCULAR | Status: AC
  Administered 2019-10-29: 10 mg via INTRAVENOUS
  Filled 2019-10-29: qty 2

## 2019-10-29 MED ORDER — SODIUM CHLORIDE 0.9% FLUSH: 3.0000 mL | Freq: Once | INTRAVENOUS | Status: DC

## 2019-10-29 MED ORDER — METOCLOPRAMIDE HCL 5 MG PO TABS
5.0000 mg | ORAL_TABLET | Freq: Four times a day (QID) | ORAL | 0 refills | Status: DC | PRN
Start: 2019-10-29 — End: 2019-10-29

## 2019-10-29 MED ORDER — FLUCONAZOLE 200 MG PO TABS
200.0000 mg | ORAL_TABLET | Freq: Once | ORAL | Status: AC
  Administered 2019-10-29: 200 mg via ORAL
  Filled 2019-10-29: qty 1

## 2019-10-29 MED ORDER — FLUCONAZOLE 200 MG PO TABS
200.0000 mg | ORAL_TABLET | Freq: Every day | ORAL | 0 refills | Status: DC
Start: 2019-10-29 — End: 2020-08-15

## 2019-10-29 MED ORDER — METOCLOPRAMIDE HCL 10 MG PO TABS
5.0000 mg | ORAL_TABLET | Freq: Four times a day (QID) | ORAL | 0 refills | Status: DC | PRN
Start: 2019-10-29 — End: 2019-10-29

## 2019-10-29 MED ORDER — FLUCONAZOLE 200 MG PO TABS
200.0000 mg | ORAL_TABLET | Freq: Every day | ORAL | 0 refills | Status: DC
Start: 2019-10-29 — End: 2019-10-29

## 2019-10-29 NOTE — ED Provider Notes (Signed)
[facility] DEPT Provider Note: Eric Spurling, MD, FACEP  CSN: [id number] MRN: [id number] ARRIVAL: 10/28/19 at 2324 ROOM: [place]  Vomiting and Dysuria   HISTORY OF PRESENT ILLNESS  10/29/19 5:13 AM Eric Farrell is a 59 y.o. male who has been feeling bad for about 2 weeks.  Specifically he has had a cough, nausea and vomiting, occasional diarrhea and generalized weakness.  Yesterday he noticed burning with urination as well as blood in his urine.  He has had a fever as high as 101.  He denies abdominal or flank pain currently but has had some lower abdominal pain on occasion.  He was given Zofran in triage without relief of his nausea.reg   Past Medical History:  Diagnosis Date  . GERD (gastroesophageal reflux disease)     History reviewed. No pertinent surgical history.  Family History  Problem Relation Age of Onset  . Cancer Father   . Hypertension Other     Social History   Tobacco Use  . Smoking status: Never Smoker  . Smokeless tobacco: Never Used  Vaping Use  . Vaping Use: Never used  Substance Use Topics  . Alcohol use: No  . Drug use: No    Prior to Admission medications   Medication Sig Start Date End Date Taking? Authorizing Provider  fluconazole (DIFLUCAN) 200 MG tablet Take 1 tablet (200 mg total) by mouth daily. 10/29/19   Kamarie Veno, Jenny Reichmann, MD  metoCLOPramide (REGLAN) 5 MG tablet Take 1 tablet (5 mg total) by mouth every 6 (six) hours as needed for nausea or vomiting. 10/29/19   Eleaner Dibartolo, Jenny Reichmann, MD  ondansetron (ZOFRAN ODT) 4 MG disintegrating tablet Take 1 tablet (4 mg total) by mouth every 8 (eight) hours as needed for nausea or vomiting. 05/08/18   Tacy Learn, PA-C  pantoprazole (PROTONIX) 20 MG tablet Take 20 mg by mouth daily.    [provider]  sucralfate (CARAFATE) 1 GM/10ML suspension Take 1 g by mouth 4 (four) times daily -  with meals and at bedtime.    [provider]  promethazine (PHENERGAN) 25 MG tablet  Take 1 tablet (25 mg total) by mouth every 6 (six) hours as needed for nausea or vomiting. Patient not taking: Reported on 05/08/2018 01/09/16 10/29/19  Orpah Greek, MD  ranitidine (ZANTAC) 150 MG tablet Take 1 tablet (150 mg total) by mouth 2 (two) times daily. Patient not taking: Reported on 05/08/2018 04/07/17 10/29/19  Frederica Kuster, PA-C    Allergies Patient has no known allergies.   REVIEW OF SYSTEMS  Negative except as noted here or in the History of Present Illness.   PHYSICAL EXAMINATION  Initial Vital Signs Blood pressure 118/81, pulse 89, temperature 98.4 F (36.9 C), resp. rate 16, SpO2 100 %.  Examination General: Well-developed, thin male in no acute distress; appearance consistent with age of record HENT: normocephalic; atraumatic; dry mucous membranes Eyes: pupils equal, round and reactive to light; extraocular muscles intact Neck: supple Heart: regular rate and rhythm Lungs: clear to auscultation bilaterally Abdomen: soft; nondistended; nontender; no masses or hepatosplenomegaly; bowel sounds present GU: No CVA tenderness Extremities: No deformity; full range of motion; pulses normal Neurologic: Awake, alert and oriented; motor function intact in all extremities and symmetric; no facial droop Skin: Warm and dry Psychiatric: Flat affect   RESULTS  Summary of this visit's results, reviewed and interpreted by myself:   EKG Interpretation  Date/Time:  Monday October 29 2019 03:04:57 EDT Ventricular Rate:  66 PR Interval:    QRS Duration: 82 QT Interval:  398 QTC Calculation: 417 R Axis:   77 Text Interpretation: Sinus rhythm with sinus arrhythmia LVH by voltage No previous ECGs available Confirmed by Shanon Rosser [phone]) on 10/29/2019 3:12:59 AM      Laboratory Studies: Results for orders placed or performed during the hospital encounter of 10/29/19 (from the past 24 hour(s))  Lipase, blood     Status: None   Collection Time: 10/29/19  2:03 AM   Result Value Ref Range   Lipase 25 11 - 51 U/L  Comprehensive metabolic panel     Status: Abnormal   Collection Time: 10/29/19  2:03 AM  Result Value Ref Range   Sodium 140 135 - 145 mmol/L   Potassium 3.8 3.5 - 5.1 mmol/L   Chloride 100 98 - 111 mmol/L   CO2 23 22 - 32 mmol/L   Glucose, Bld 128 (H) 70 - 99 mg/dL   BUN 20 6 - 20 mg/dL   Creatinine, Ser 1.39 (H) 0.61 - 1.24 mg/dL   Calcium 9.4 8.9 - 10.3 mg/dL   Total Protein 8.5 (H) 6.5 - 8.1 g/dL   Albumin 4.5 3.5 - 5.0 g/dL   AST 25 15 - 41 U/L   ALT 16 0 - 44 U/L   Alkaline Phosphatase 67 38 - 126 U/L   Total Bilirubin 1.5 (H) 0.3 - 1.2 mg/dL   GFR calc non Af Amer 56 (L) >60 mL/min   GFR calc Af Amer >60 >60 mL/min   Anion gap 17 (H) 5 - 15  CBC     Status: Abnormal   Collection Time: 10/29/19  2:03 AM  Result Value Ref Range   WBC 11.7 (H) 4.0 - 10.5 K/uL   RBC 4.94 4.22 - 5.81 MIL/uL   Hemoglobin 14.9 13.0 - 17.0 g/dL   HCT 45.4 39 - 52 %   MCV 91.9 80.0 - 100.0 fL   MCH 30.2 26.0 - 34.0 pg   MCHC 32.8 30.0 - 36.0 g/dL   RDW 14.4 11.5 - 15.5 %   Platelets 225 150 - 400 K/uL   nRBC 0.0 0.0 - 0.2 %  Differential     Status: Abnormal   Collection Time: 10/29/19  2:03 AM  Result Value Ref Range   Neutrophils Relative % 62 %   Neutro Abs 7.5 1.7 - 7.7 K/uL   Lymphocytes Relative 26 %   Lymphs Abs 3.1 0.7 - 4.0 K/uL   Monocytes Relative 11 %   Monocytes Absolute 1.4 (H) 0 - 1 K/uL   Eosinophils Relative 0 %   Eosinophils Absolute 0.0 0 - 0 K/uL   Basophils Relative 1 %   Basophils Absolute 0.1 0 - 0 K/uL   WBC Morphology VACUOLATED NEUTROPHILS    Immature Granulocytes 0 %   Abs Immature Granulocytes 0.04 0.00 - 0.07 K/uL   Reactive, Benign Lymphocytes PRESENT   Urinalysis, Routine w reflex microscopic     Status: Abnormal   Collection Time: 10/29/19  4:54 AM  Result Value Ref Range   Color, Urine AMBER (A) YELLOW   APPearance CLOUDY (A) CLEAR   Specific Gravity, Urine 1.031 (H) 1.005 - 1.030   pH 5.0 5.0 -  8.0   Glucose, UA NEGATIVE NEGATIVE mg/dL   Hgb urine dipstick LARGE (A) NEGATIVE   Bilirubin Urine NEGATIVE NEGATIVE   Ketones, ur 20 (A) NEGATIVE mg/dL   Protein, ur 100 (A) NEGATIVE mg/dL   Nitrite NEGATIVE NEGATIVE  Leukocytes,Ua NEGATIVE NEGATIVE   RBC / HPF 21-50 0 - 5 RBC/hpf   WBC, UA 21-50 0 - 5 WBC/hpf   Bacteria, UA RARE (A) NONE SEEN   Squamous Epithelial / LPF 0-5 0 - 5   Mucus PRESENT    Budding Yeast PRESENT   Rapid HIV screen (HIV 1/2 Ab+Ag)     Status: None   Collection Time: 10/29/19  5:39 AM  Result Value Ref Range   HIV-1 P24 Antigen - HIV24 NON REACTIVE NON REACTIVE   HIV 1/2 Antibodies NON REACTIVE NON REACTIVE   Interpretation (HIV Ag Ab)      A non reactive test result means that HIV 1 or HIV 2 antibodies and HIV 1 p24 antigen were not detected in the specimen.   Imaging Studies: DG Chest 2 View  Result Date: 10/29/2019 CLINICAL DATA:  Cough EXAM: CHEST - 2 VIEW COMPARISON:  04/07/2017 FINDINGS: Normal heart size and mediastinal contours. No acute infiltrate or edema. No effusion or pneumothorax. No acute osseous findings. IMPRESSION: Negative chest. Electronically Signed   By: Monte Fantasia M.D.   On: 10/29/2019 06:30    ED COURSE and MDM  Nursing notes, initial and subsequent vitals signs, including pulse oximetry, reviewed and interpreted by myself.  Vitals:   10/29/19 0009 10/29/19 0305 10/29/19 0533  BP: 118/81  132/87  Pulse: 89  (!) 53  Resp: 16  15  Temp: 98.9 F (37.2 C) 98.4 F (36.9 C)   SpO2: 100%  97%   Medications  sodium chloride flush (NS) 0.9 % injection 3 mL (has no administration in time range)  ondansetron (ZOFRAN-ODT) disintegrating tablet 4 mg (4 mg Oral Given 10/29/19 0202)  sodium chloride 0.9 % bolus 1,000 mL (1,000 mLs Intravenous New Bag/Given 10/29/19 0534)  cefTRIAXone (ROCEPHIN) 1 g in sodium chloride 0.9 % 100 mL IVPB (1 g Intravenous New Bag/Given 10/29/19 0537)  metoCLOPramide (REGLAN) injection 10 mg (10 mg  Intravenous Given 10/29/19 0533)  fluconazole (DIFLUCAN) tablet 200 mg (200 mg Oral Given 10/29/19 0559)   5:23 AM Patient appears clinically dehydrated.  IV fluid bolus and Reglan ordered.  Urinalysis consistent with urinary tract infection based on red cells and white cells although his nitrite and leukocytes are negative.  Rocephin given for possible bacterial etiology and Diflucan 200 mg ordered for candiduria.  Rapid HIV test ordered.  7:30 AM Patient feeling better after IV fluid bolus and Reglan (he did not get adequate relief from Zofran).  He is now able to eat ice without vomiting.  His urinary tract infection appears to be due to yeast not bacteria.  No bacteria were seen under the microscope and he was nitrite and leukocyte esterase negative.  Urine has been sent for bacterial culture and we will follow up on this.  In the meantime we will treat with fluconazole 200 mg daily for 2 weeks.  PROCEDURES  Procedures   ED DIAGNOSES     ICD-10-CM   1. Nausea and vomiting in adult  R11.2   2. Dehydration  E86.0   3. Yeast UTI  B37.49        Justina Bertini, Jenny Reichmann, MD 10/29/19 [phone]

## 2019-10-29 NOTE — ED Triage Notes (Signed)
Patient arrived stating over the last few days he has been vomiting. Then today noticed pain with urination and dark red blood in urine.

## 2019-10-30 LAB — URINE CULTURE: Culture: NO GROWTH

## 2019-11-07 NOTE — Telephone Encounter (Signed)
Sent records to the [facility] office to [place]. She has been notified

## 2019-11-07 NOTE — Telephone Encounter (Signed)
Records received from the [place] and the following:  -Colonoscopy (06/29/2016, [facility]): 1.5 cm pedunculated sigmoid polyp located 35 cm resected with hot snare requiring clip placement to large visible vessel (polyp not recovered; no path), 1.5 cm pedunculated polyp in the distal sigmoid at 20 cm (TA) resected with hot snare, 5 mm descending colon polyp (TA) resected with cold snare.  Excellent prep.  Mild sigmoid diverticulosis, small internal hemorrhoids.  Repeat in 3 years.  -EGD (10/2013): Severe grade D esophagitis of the distal esophagus.  Normal stomach/duodenum  Was seen by [facility] Gastroenterologist on 10/16/2019 for surveillance colonoscopy.  At that time, patient also reported very low bowel habits of "diarrhea and sometimes constipation", with 5-3-4 bowel movements per day.  Variable consistency but no mucus, stearrhea.  Can also have nausea with infrequent vomiting.  Has had symptoms since he was in [employer].  Takes MiraLAX 1-2 times a month when he is having hard stools.  Otherwise use fiber supplement.  Does report "my stomach is burning" for a number of years.  Generic Protonix helps, but only takes as needed.  Regular THC.  Quit drinking 7-8 years ago.  Due to lack of space/availability at the [place], he was referred to [place] for colonoscopy for ongoing polyp surveillance.  Past medical history: GERD with erosive esophagitis, HTN, depression, hearing loss, history of kidney stones per patient, history of spermatocele  Medications: Pantoprazole 40 mg daily, psyllium twice daily, MiraLAX as needed  Most recent labs: -08/31/2019: WBC 5.8, H/H 13.1/39, MCV/RDW 88.8/14.2, PLT 283, normal CMP  Okay to schedule direct with me for colonoscopy for ongoing polyp surveillance.

## 2019-11-08 ENCOUNTER — Encounter: Payer: Self-pay | Admitting: Gastroenterology

## 2019-12-28 ENCOUNTER — Ambulatory Visit (AMBULATORY_SURGERY_CENTER): Payer: Self-pay | Admitting: *Deleted

## 2019-12-28 ENCOUNTER — Other Ambulatory Visit: Payer: Self-pay

## 2019-12-28 VITALS — Ht 72.0 in | Wt 156.4 lb

## 2019-12-28 DIAGNOSIS — Z800 Family history of malignant neoplasm of digestive organs: Secondary | ICD-10-CM

## 2019-12-28 DIAGNOSIS — Z86010 Personal history of colonic polyps: Secondary | ICD-10-CM

## 2019-12-28 MED ORDER — PEG-KCL-NACL-NASULF-NA ASC-C 100 G PO SOLR: 1.0000 | Freq: Once | ORAL | 0 refills | Status: AC

## 2019-12-28 NOTE — Progress Notes (Signed)

## 2020-01-03 ENCOUNTER — Other Ambulatory Visit: Payer: Self-pay

## 2020-01-03 ENCOUNTER — Encounter: Payer: Self-pay | Admitting: Gastroenterology

## 2020-01-03 ENCOUNTER — Ambulatory Visit (AMBULATORY_SURGERY_CENTER): Payer: No Typology Code available for payment source | Admitting: Gastroenterology

## 2020-01-03 VITALS — BP 117/75 | HR 66 | Temp 97.4°F | Resp 13 | Ht 72.0 in | Wt 156.0 lb

## 2020-01-03 DIAGNOSIS — K640 First degree hemorrhoids: Secondary | ICD-10-CM

## 2020-01-03 DIAGNOSIS — Z86010 Personal history of colonic polyps: Secondary | ICD-10-CM

## 2020-01-03 DIAGNOSIS — K5730 Diverticulosis of large intestine without perforation or abscess without bleeding: Secondary | ICD-10-CM

## 2020-01-03 DIAGNOSIS — D122 Benign neoplasm of ascending colon: Secondary | ICD-10-CM

## 2020-01-03 DIAGNOSIS — R194 Change in bowel habit: Secondary | ICD-10-CM | POA: Diagnosis not present

## 2020-01-03 DIAGNOSIS — D124 Benign neoplasm of descending colon: Secondary | ICD-10-CM

## 2020-01-03 DIAGNOSIS — D125 Benign neoplasm of sigmoid colon: Secondary | ICD-10-CM

## 2020-01-03 DIAGNOSIS — R197 Diarrhea, unspecified: Secondary | ICD-10-CM

## 2020-01-03 DIAGNOSIS — Z800 Family history of malignant neoplasm of digestive organs: Secondary | ICD-10-CM

## 2020-01-03 MED ORDER — SODIUM CHLORIDE 0.9 % IV SOLN: 500.0000 mL | Freq: Once | INTRAVENOUS | Status: DC

## 2020-01-03 MED ORDER — SODIUM CHLORIDE 0.9 % IV SOLN
4.0000 mg | Freq: Once | INTRAVENOUS | Status: AC
  Administered 2020-01-03: 4 mg via INTRAVENOUS

## 2020-01-03 NOTE — Progress Notes (Signed)
Pt's states no medical or surgical changes since previsit or office visit. 

## 2020-01-03 NOTE — Progress Notes (Signed)
Upon arriving to admitting, Patient states he completed all of his prep this am, but has vomited X3 since arriving here to [facility]. Order obtained from Cr. Cirigliano by Judson Roch Monday RN for Zofran 4mg  IV which she administered as per protocol. Patient resting in stretcher with IV NS wide open. Patient states he has some relief of nausea at this time, will continue to monitor.

## 2020-01-03 NOTE — Progress Notes (Signed)
A and O x3. Report to RN. Tolerated MAC anesthesia well.

## 2020-01-03 NOTE — Progress Notes (Signed)
VS taken by C.W. 

## 2020-01-03 NOTE — Patient Instructions (Signed)
Handouts given for polyps, hemorrhoids and diverticulosis.  YOU HAD AN ENDOSCOPIC PROCEDURE TODAY AT [facility]:   Refer to the procedure report that was given to you for any specific questions about what was found during the examination.  If the procedure report does not answer your questions, please call your gastroenterologist to clarify.  If you requested that your care partner not be given the details of your procedure findings, then the procedure report has been included in a sealed envelope for you to review at your convenience later.  YOU SHOULD EXPECT: Some feelings of bloating in the abdomen. Passage of more gas than usual.  Walking can help get rid of the air that was put into your GI tract during the procedure and reduce the bloating. If you had a lower endoscopy (such as a colonoscopy or flexible sigmoidoscopy) you may notice spotting of blood in your stool or on the toilet paper. If you underwent a bowel prep for your procedure, you may not have a normal bowel movement for a few days.  Please Note:  You might notice some irritation and congestion in your nose or some drainage.  This is from the oxygen used during your procedure.  There is no need for concern and it should clear up in a day or so.  SYMPTOMS TO REPORT IMMEDIATELY:   Following lower endoscopy (colonoscopy or flexible sigmoidoscopy):  Excessive amounts of blood in the stool  Significant tenderness or worsening of abdominal pains  Swelling of the abdomen that is new, acute  Fever of 100F or higher  For urgent or emergent issues, a gastroenterologist can be reached at any hour by calling [phone]. Do not use MyChart messaging for urgent concerns.    DIET:  We do recommend a small meal at first, but then you may proceed to your regular diet.  Drink plenty of fluids but you should avoid alcoholic beverages for 24 hours.  ACTIVITY:  You should plan to take it easy for the rest of today and you  should NOT DRIVE or use heavy machinery until tomorrow (because of the sedation medicines used during the test).    FOLLOW UP: Our staff will call the number listed on your records 48-72 hours following your procedure to check on you and address any questions or concerns that you may have regarding the information given to you following your procedure. If we do not reach you, we will leave a message.  We will attempt to reach you two times.  During this call, we will ask if you have developed any symptoms of COVID 19. If you develop any symptoms (ie: fever, flu-like symptoms, shortness of breath, cough etc.) before then, please call [phone].  If you test positive for Covid 19 in the 2 weeks post procedure, please call and report this information to [place].    If any biopsies were taken you will be contacted by phone or by letter within the next 1-3 weeks.  Please call us at [phone] if you have not heard about the biopsies in 3 weeks.    SIGNATURES/CONFIDENTIALITY: You and/or your care partner have signed paperwork which will be entered into your electronic medical record.  These signatures attest to the fact that that the information above on your After Visit Summary has been reviewed and is understood.  Full responsibility of the confidentiality of this discharge information lies with you and/or your care-partner.

## 2020-01-03 NOTE — Op Note (Signed)
[facility] Patient Name: Eric Farrell Procedure Date: 01/03/2020 11:50 AM MRN: [id number] Endoscopist: Gerrit Heck , MD Age: 59 Referring MD:  Date of Birth: 05-06-1961 Gender: Male Account #: [id number] Procedure:                Colonoscopy Indications:              Surveillance: Personal history of adenomatous                            polyps on last colonoscopy 3 years ago                           Incidental - Change in bowel habits, Incidental -                            Diarrhea                           Last colonoscopy was at [facility] in 06/2016                            notable for a 1.5 cm pedunculated sigmoid polyp                            located 35 cm resected with hot snare requiring                            clip placement to large visible vessel (polyp not                            recovered; no path), 1.5 cm pedunculated polyp in                            the distal sigmoid at 20 cm (TA) resected with hot                            snare, 5 mm descending colon polyp (TA) resected                            with cold snare. Excellent prep. Mild sigmoid                            diverticulosis, small internal hemorrhoids. Repeat                            in 3 years. Medicines:                Monitored Anesthesia Care Procedure:                Pre-Anesthesia Assessment:                           - Prior to the procedure, a History and Physical  was performed, and patient medications and                            allergies were reviewed. The patient's tolerance of                            previous anesthesia was also reviewed. The risks                            and benefits of the procedure and the sedation                            options and risks were discussed with the patient.                            All questions were answered, and informed consent                            was obtained. Prior  Anticoagulants: The patient has                            taken no previous anticoagulant or antiplatelet                            agents. ASA Grade Assessment: II - A patient with                            mild systemic disease. After reviewing the risks                            and benefits, the patient was deemed in                            satisfactory condition to undergo the procedure.                           After obtaining informed consent, the colonoscope                            was passed under direct vision. Throughout the                            procedure, the patient's blood pressure, pulse, and                            oxygen saturations were monitored continuously. The                            Colonoscope was introduced through the anus and                            advanced to the the terminal ileum. The colonoscopy  was performed without difficulty. The patient                            tolerated the procedure well. The quality of the                            bowel preparation was good. The terminal ileum,                            ileocecal valve, appendiceal orifice, and rectum                            were photographed. Scope In: 11:55:15 AM Scope Out: 12:17:51 PM Scope Withdrawal Time: 0 hours 20 minutes 9 seconds  Total Procedure Duration: 0 hours 22 minutes 36 seconds  Findings:                 The perianal and digital rectal examinations were                            normal.                           Five sessile polyps were found in the sigmoid colon                            (2), descending colon (2), and ascending colon (1).                            The polyps were 3 to 5 mm in size. These polyps                            were removed with a cold snare. Resection and                            retrieval were complete. Estimated blood loss was                            minimal.                            The mucosa was otherwise normal appearing                            throughout the colon. Biopsies for histology were                            taken with a cold forceps for evaluation of                            microscopic colitis. Estimated blood loss was                            minimal.  Multiple small and large-mouthed diverticula were                            found in the sigmoid colon and ascending colon.                           A small post polypectomy scar was found in the                            sigmoid colon, located 30 cm from the anal verge.                            There was polypoid tissue adjacent to the scar. The                            polypoid tissue was removed with a cold snare.                            Resection and retrieval were complete. Estimated                            blood loss was minimal.                           Non-bleeding internal hemorrhoids were found during                            retroflexion. The hemorrhoids were small.                           The terminal ileum appeared normal. Complications:            No immediate complications. Estimated Blood Loss:     Estimated blood loss was minimal. Impression:               - Five 3 to 5 mm polyps in the sigmoid colon, in                            the descending colon and in the ascending colon,                            removed with a cold snare. Resected and retrieved.                           - Diverticulosis in the sigmoid colon and in the                            ascending colon.                           - Post-polypectomy scar in the sigmoid colon. There                            was adjacent polypoid tissue that was resected with  cold snare.                           - Otherwise, normal mucosa in the entire examined                            colon. Biopsied.                           - Non-bleeding internal  hemorrhoids.                           - The examined portion of the ileum was normal. Recommendation:           - Patient has a contact number available for                            emergencies. The signs and symptoms of potential                            delayed complications were discussed with the                            patient. Return to normal activities tomorrow.                            Written discharge instructions were provided to the                            patient.                           - Resume previous diet.                           - Continue present medications.                           - Await pathology results.                           - Repeat colonoscopy for surveillance based on                            pathology results.                           - Follow-up in the GI clinic at the [facility] as                            previously planned.                           - Use fiber, for example Citrucel, Fibercon, Konsyl                            or Metamucil. Gerrit Heck, MD 01/03/2020 12:36:13 PM

## 2020-01-03 NOTE — Progress Notes (Signed)
Pt states feeling better, states no nausea.

## 2020-01-03 NOTE — Progress Notes (Signed)
Called to room to assist during endoscopic procedure.  Patient ID and intended procedure confirmed with present staff. Received instructions for my participation in the procedure from the performing physician.  

## 2020-01-07 ENCOUNTER — Telehealth: Payer: Self-pay | Admitting: *Deleted

## 2020-01-07 ENCOUNTER — Encounter: Payer: Self-pay | Admitting: Gastroenterology

## 2020-01-07 NOTE — Telephone Encounter (Signed)
°  Follow up Call-  Call back number 01/03/2020  Post procedure Call Back phone  # [phone]  Permission to leave phone message Yes  Some recent data might be hidden     Patient questions:  Do you have a fever, pain , or abdominal swelling? No. Pain Score  0 *  Have you tolerated food without any problems? Yes.    Have you been able to return to your normal activities? Yes.    Do you have any questions about your discharge instructions: Diet   No. Medications  No. Follow up visit  No.  Do you have questions or concerns about your Care? No.- Patient reports he had vomiting for two days after his procedure. Denies fevers. States he is feeling better now and able to eat and drink normally. RN instructed patient to call back if he has any other symptoms develop.   Actions: * If pain score is 4 or above: No action needed, pain <4.  1. Have you developed a fever since your procedure? no  2.   Have you had an respiratory symptoms (SOB or cough) since your procedure? no  3.   Have you tested positive for COVID 19 since your procedure no  4.   Have you had any family members/close contacts diagnosed with the COVID 19 since your procedure?  no   If yes to any of these questions please route to Joylene John, RN and Joella Prince, RN

## 2020-08-15 ENCOUNTER — Emergency Department (HOSPITAL_COMMUNITY): Payer: No Typology Code available for payment source

## 2020-08-15 ENCOUNTER — Encounter (HOSPITAL_COMMUNITY): Payer: Self-pay

## 2020-08-15 ENCOUNTER — Emergency Department (HOSPITAL_COMMUNITY)
Admission: EM | Admit: 2020-08-15 | Discharge: 2020-08-15 | Disposition: A | Discharge status: Home / Self Care | Payer: No Typology Code available for payment source | Attending: Emergency Medicine | Admitting: Emergency Medicine

## 2020-08-15 DIAGNOSIS — K219 Gastro-esophageal reflux disease without esophagitis: Secondary | ICD-10-CM | POA: Diagnosis not present

## 2020-08-15 DIAGNOSIS — R109 Unspecified abdominal pain: Secondary | ICD-10-CM | POA: Insufficient documentation

## 2020-08-15 DIAGNOSIS — R7989 Other specified abnormal findings of blood chemistry: Secondary | ICD-10-CM | POA: Insufficient documentation

## 2020-08-15 LAB — CBC WITH DIFFERENTIAL/PLATELET
Abs Immature Granulocytes: 0.02 10*3/uL (ref 0.00–0.07)
Basophils Absolute: 0.1 10*3/uL (ref 0.0–0.1)
Basophils Relative: 1 %
Eosinophils Absolute: 0.1 10*3/uL (ref 0.0–0.5)
Eosinophils Relative: 1 %
HCT: 45.9 % (ref 39.0–52.0)
Hemoglobin: 15.4 g/dL (ref 13.0–17.0)
Immature Granulocytes: 0 %
Lymphocytes Relative: 40 %
Lymphs Abs: 2.8 10*3/uL (ref 0.7–4.0)
MCH: 29.4 pg (ref 26.0–34.0)
MCHC: 33.6 g/dL (ref 30.0–36.0)
MCV: 87.8 fL (ref 80.0–100.0)
Monocytes Absolute: 0.5 10*3/uL (ref 0.1–1.0)
Monocytes Relative: 7 %
Neutro Abs: 3.6 10*3/uL (ref 1.7–7.7)
Neutrophils Relative %: 51 %
Platelets: 290 10*3/uL (ref 150–400)
RBC: 5.23 MIL/uL (ref 4.22–5.81)
RDW: 12.9 % (ref 11.5–15.5)
WBC: 7.1 10*3/uL (ref 4.0–10.5)
nRBC: 0 % (ref 0.0–0.2)

## 2020-08-15 LAB — URINALYSIS, ROUTINE W REFLEX MICROSCOPIC
Bacteria, UA: NONE SEEN
Bilirubin Urine: NEGATIVE
Glucose, UA: NEGATIVE mg/dL
Ketones, ur: NEGATIVE mg/dL
Leukocytes,Ua: NEGATIVE
Nitrite: NEGATIVE
Protein, ur: NEGATIVE mg/dL
Specific Gravity, Urine: 1.02 (ref 1.005–1.030)
pH: 5 (ref 5.0–8.0)

## 2020-08-15 LAB — BASIC METABOLIC PANEL
Anion gap: 9 (ref 5–15)
Anion gap: 5 (ref 5–15)
BUN: 44 mg/dL — ABNORMAL HIGH (ref 6–20)
BUN: 34 mg/dL — ABNORMAL HIGH (ref 6–20)
CO2: 27 mmol/L (ref 22–32)
CO2: 25 mmol/L (ref 22–32)
Calcium: 9.6 mg/dL (ref 8.9–10.3)
Calcium: 8.2 mg/dL — ABNORMAL LOW (ref 8.9–10.3)
Chloride: 97 mmol/L — ABNORMAL LOW (ref 98–111)
Chloride: 102 mmol/L (ref 98–111)
Creatinine, Ser: 1.84 mg/dL — ABNORMAL HIGH (ref 0.61–1.24)
Creatinine, Ser: 1.51 mg/dL — ABNORMAL HIGH (ref 0.61–1.24)
GFR, Estimated: 42 mL/min — ABNORMAL LOW (ref >60)
GFR, Estimated: 54 mL/min — ABNORMAL LOW (ref >60)
Glucose, Bld: 114 mg/dL — ABNORMAL HIGH (ref 70–99)
Glucose, Bld: 94 mg/dL (ref 70–99)
Potassium: 3.7 mmol/L (ref 3.5–5.1)
Potassium: 3.9 mmol/L (ref 3.5–5.1)
Sodium: 133 mmol/L — ABNORMAL LOW (ref 135–145)
Sodium: 132 mmol/L — ABNORMAL LOW (ref 135–145)

## 2020-08-15 MED ORDER — SODIUM CHLORIDE 0.9 % IV BOLUS
1000.0000 mL | Freq: Once | INTRAVENOUS | Status: AC
  Administered 2020-08-15: 1000 mL via INTRAVENOUS

## 2020-08-15 NOTE — Discharge Instructions (Signed)
Your kidney function lab results improved with fluids.  Please continue to drink plenty of water.  Please follow-up with your primary care doctor to recheck these labs within the week.

## 2020-08-15 NOTE — ED Provider Notes (Signed)
[facility] DEPT Provider Note   CSN: [id number] Arrival date & time: 08/15/20  1348     History Chief Complaint  Patient presents with  . Flank Pain    Eric Farrell is a 60 y.o. male.  The history is provided by the patient. No language interpreter was used.  Flank Pain This is a recurrent problem. The current episode started yesterday. The problem occurs constantly. The problem has been gradually worsening. Pertinent negatives include no abdominal pain. Nothing aggravates the symptoms. Nothing relieves the symptoms. He has tried nothing for the symptoms.   Pt report he was sent here from the [place].  Pt reports he ws told his kidneys need evaluation.   [facility] show elevation in bun and creatine to 3.0 and 48.     Past Medical History:  Diagnosis Date  . Arthritis   . GERD (gastroesophageal reflux disease)   . Kidney stones   . Scoliosis     There are no problems to display for this patient.   Past Surgical History:  Procedure Laterality Date  . COLONOSCOPY         Family History  Problem Relation Age of Onset  . Cancer Father   . Colon cancer Father   . Hypertension Other   . Esophageal cancer Neg Hx   . Stomach cancer Neg Hx   . Rectal cancer Neg Hx     Social History   Tobacco Use  . Smoking status: Never Smoker  . Smokeless tobacco: Never Used  Vaping Use  . Vaping Use: Never used  Substance Use Topics  . Alcohol use: No  . Drug use: Yes    Types: Marijuana    Comment: last used 1/2 joint last night    Home Medications Prior to Admission medications   Medication Sig Start Date End Date Taking? Authorizing Provider  fluconazole (DIFLUCAN) 200 MG tablet Take 1 tablet (200 mg total) by mouth daily. 10/29/19   Molpus, Jenny Reichmann, MD  metoCLOPramide (REGLAN) 10 MG tablet Take 1 tablet (10 mg total) by mouth every 6 (six) hours as needed for nausea or vomiting. 10/29/19   Molpus, John, MD  pantoprazole (PROTONIX) 20  MG tablet Take 20 mg by mouth daily.    [provider]  promethazine (PHENERGAN) 25 MG tablet Take 1 tablet (25 mg total) by mouth every 6 (six) hours as needed for nausea or vomiting. Patient not taking: Reported on 05/08/2018 01/09/16 10/29/19  Orpah Greek, MD  ranitidine (ZANTAC) 150 MG tablet Take 1 tablet (150 mg total) by mouth 2 (two) times daily. Patient not taking: Reported on 05/08/2018 04/07/17 10/29/19  Frederica Kuster, PA-C  sucralfate (CARAFATE) 1 GM/10ML suspension Take 1 g by mouth 4 (four) times daily -  with meals and at bedtime.  10/29/19  [provider]    Allergies    Patient has no known allergies.  Review of Systems   Review of Systems  Gastrointestinal: Negative for abdominal pain.  Genitourinary: Positive for flank pain.  All other systems reviewed and are negative.   Physical Exam Updated Vital Signs BP (!) 139/106 (BP Location: Right Arm)   Pulse 72   Temp 98.9 F (37.2 C) (Oral)   Resp 18   SpO2 97%   Physical Exam Vitals reviewed.  HENT:     Right Ear: Tympanic membrane normal.     Left Ear: Tympanic membrane normal.     Mouth/Throat:     Mouth:  Mucous membranes are moist.  Eyes:     Extraocular Movements: Extraocular movements intact.     Pupils: Pupils are equal, round, and reactive to light.  Cardiovascular:     Rate and Rhythm: Normal rate.     Pulses: Normal pulses.  Pulmonary:     Effort: Pulmonary effort is normal.  Abdominal:     General: Abdomen is flat.  Musculoskeletal:        General: Normal range of motion.     Cervical back: Normal range of motion.  Skin:    General: Skin is warm.  Neurological:     General: No focal deficit present.  Psychiatric:        Mood and Affect: Mood normal.     ED Results / Procedures / Treatments   Labs (all labs ordered are listed, but only abnormal results are displayed) Labs Reviewed  CBC WITH DIFFERENTIAL/PLATELET  URINALYSIS, ROUTINE W REFLEX MICROSCOPIC   BASIC METABOLIC PANEL    EKG None  Radiology No results found.  Procedures Procedures   Medications Ordered in ED Medications  sodium chloride 0.9 % bolus 1,000 mL (1,000 mLs Intravenous New Bag/Given 08/15/20 1429)    ED Course  I have reviewed the triage vital signs and the nursing notes.  Pertinent labs & imaging results that were available during my care of the patient were reviewed by me and considered in my medical decision making (see chart for details).    MDM Rules/Calculators/A&P                         MDM:  Labs and ct ordered.    Final Clinical Impression(s) / ED Diagnoses Final diagnoses:  Flank pain  Elevated serum creatinine    Rx / DC Orders ED Discharge Orders    None    An After Visit Summary was printed and given to the patient.   Sidney Ace 08/17/20 [id number]    Davonna Belling, MD 08/18/20 [phone]

## 2020-08-15 NOTE — ED Provider Notes (Addendum)
Accepted handoff at shift change from [facility]. Please see prior provider note for more detail.   Briefly: Patient is 60 y.o.   DDX: concern for renal stone AKI Plan: Follow-up on repeat BMP after fluids discharge if improved.    Physical Exam  BP 122/89 (BP Location: Right Arm)   Pulse 61   Temp 98.9 F (37.2 C) (Oral)   Resp 17   SpO2 100%   Physical Exam  ED Course/Procedures     Procedures Results for orders placed or performed during the hospital encounter of 08/15/20  CBC with Differential/Platelet  Result Value Ref Range   WBC 7.1 4.0 - 10.5 K/uL   RBC 5.23 4.22 - 5.81 MIL/uL   Hemoglobin 15.4 13.0 - 17.0 g/dL   HCT 45.9 39.0 - 52.0 %   MCV 87.8 80.0 - 100.0 fL   MCH 29.4 26.0 - 34.0 pg   MCHC 33.6 30.0 - 36.0 g/dL   RDW 12.9 11.5 - 15.5 %   Platelets 290 150 - 400 K/uL   nRBC 0.0 0.0 - 0.2 %   Neutrophils Relative % 51 %   Neutro Abs 3.6 1.7 - 7.7 K/uL   Lymphocytes Relative 40 %   Lymphs Abs 2.8 0.7 - 4.0 K/uL   Monocytes Relative 7 %   Monocytes Absolute 0.5 0.1 - 1.0 K/uL   Eosinophils Relative 1 %   Eosinophils Absolute 0.1 0.0 - 0.5 K/uL   Basophils Relative 1 %   Basophils Absolute 0.1 0.0 - 0.1 K/uL   Immature Granulocytes 0 %   Abs Immature Granulocytes 0.02 0.00 - 0.07 K/uL  Urinalysis, Routine w reflex microscopic Urine, Clean Catch  Result Value Ref Range   Color, Urine YELLOW YELLOW   APPearance CLEAR CLEAR   Specific Gravity, Urine 1.020 1.005 - 1.030   pH 5.0 5.0 - 8.0   Glucose, UA NEGATIVE NEGATIVE mg/dL   Hgb urine dipstick MODERATE (A) NEGATIVE   Bilirubin Urine NEGATIVE NEGATIVE   Ketones, ur NEGATIVE NEGATIVE mg/dL   Protein, ur NEGATIVE NEGATIVE mg/dL   Nitrite NEGATIVE NEGATIVE   Leukocytes,Ua NEGATIVE NEGATIVE   RBC / HPF 11-20 0 - 5 RBC/hpf   WBC, UA 0-5 0 - 5 WBC/hpf   Bacteria, UA NONE SEEN NONE SEEN   Mucus PRESENT    Uric Acid Crys, UA PRESENT   Basic metabolic panel  Result Value Ref Range   Sodium 133  (L) 135 - 145 mmol/L   Potassium 3.7 3.5 - 5.1 mmol/L   Chloride 97 (L) 98 - 111 mmol/L   CO2 27 22 - 32 mmol/L   Glucose, Bld 114 (H) 70 - 99 mg/dL   BUN 44 (H) 6 - 20 mg/dL   Creatinine, Ser 1.84 (H) 0.61 - 1.24 mg/dL   Calcium 9.6 8.9 - 10.3 mg/dL   GFR, Estimated 42 (L) >60 mL/min   Anion gap 9 5 - 15  Basic metabolic panel  Result Value Ref Range   Sodium 132 (L) 135 - 145 mmol/L   Potassium 3.9 3.5 - 5.1 mmol/L   Chloride 102 98 - 111 mmol/L   CO2 25 22 - 32 mmol/L   Glucose, Bld 94 70 - 99 mg/dL   BUN 34 (H) 6 - 20 mg/dL   Creatinine, Ser 1.51 (H) 0.61 - 1.24 mg/dL   Calcium 8.2 (L) 8.9 - 10.3 mg/dL   GFR, Estimated 54 (L) >60 mL/min   Anion gap 5 5 - 15   CT  Renal Stone Study  Result Date: 08/15/2020 CLINICAL DATA:  Bilateral flank pain.  Kidney stone suspected. EXAM: CT ABDOMEN AND PELVIS WITHOUT CONTRAST TECHNIQUE: Multidetector CT imaging of the abdomen and pelvis was performed following the standard protocol without IV contrast. COMPARISON:  CT renal 05/08/2018. FINDINGS: Lower chest: No acute abnormality. Hepatobiliary: Couple left hepatic lobe fluid density lesions likely represent simple hepatic cysts. Otherwise no focal liver abnormality. No gallstones, gallbladder wall thickening, or pericholecystic fluid. No biliary dilatation. Pancreas: No focal lesion. Normal pancreatic contour. No surrounding inflammatory changes. No main pancreatic ductal dilatation. Spleen: Normal in size without focal abnormality. Adrenals/Urinary Tract: No adrenal nodule bilaterally. There is a 6 mm calcified stone within the right kidney. No left nephrolithiasis. Question mild fullness of the right superior renal pole calyx. There is a 2.5 cm fluid density lesion within left kidney likely represents a simple renal cyst. No contour-deforming renal mass. No ureterolithiasis or hydroureter. The urinary bladder is unremarkable. Stomach/Bowel: Stomach is within normal limits. No evidence of bowel wall  thickening or dilatation. Appendix appears normal. Vascular/Lymphatic: No abdominal aorta or iliac aneurysm. Mild atherosclerotic plaque of the aorta and its branches. No abdominal, pelvic, or inguinal lymphadenopathy. Reproductive: Prostate is unremarkable. Other: No intraperitoneal free fluid. No intraperitoneal free gas. No organized fluid collection. Musculoskeletal: No abdominal wall hernia or abnormality. No suspicious lytic or blastic osseous lesions. No acute displaced fracture. Multilevel degenerative changes of the spine. IMPRESSION: 1. A 6 mm right superior pole nephrolithiasis with question of mild fullness of the associated superior renal calyx. No definite hydronephrosis. 2.  Aortic Atherosclerosis (ICD10-I70.0). Electronically Signed   By: Iven Finn M.D.   On: 08/15/2020 15:28    MDM  Patient reassessed after 1 L normal saline.  Creatinine 1.51 this is improved.  CT renal stone study shows stone--he will follow-up with urology.  Will discharge with follow-up with PCP and recheck.   Patient with creatinine baseline approximately 1.4.  Presented today after abnormal labs were obtained at [facility] with creatinine of 3.  Labs were rechecked here with a creatinine of 1.8.  He was given IV fluids given that he has elevated BUN creatinine ratio is consistent with prerenal cause of elevated creatinine.  He does not meet criteria for AKI.  Patient given 1 L of normal saline and BUN redrawn which is 1.5 now.  Will discharge home with recheck of lab work within the next week.  Return precautions given.    Tedd Sias, [place] 08/15/20 1933    Tedd Sias, [place] 08/15/20 2326    Daleen Bo, MD 08/18/20 [phone]

## 2020-08-15 NOTE — ED Notes (Signed)
Pt verbalized understanding of d/c and and follow up care. Ambulatory with steady gait.

## 2020-08-15 NOTE — ED Triage Notes (Signed)
Pt presents with c/o bilateral flank pain. Pt reports that he has been seen by the [facility] yesterday and was told that his kidneys were not functioning properly and need to be monitored.

## 2020-09-01 ENCOUNTER — Inpatient Hospital Stay (HOSPITAL_COMMUNITY)
Admission: EM | Admit: 2020-09-01 | Discharge: 2020-09-03 | DRG: 661 | Disposition: A | Discharge status: Home / Self Care | Payer: No Typology Code available for payment source | Attending: Family Medicine | Admitting: Family Medicine

## 2020-09-01 ENCOUNTER — Encounter (HOSPITAL_COMMUNITY): Payer: Self-pay

## 2020-09-01 ENCOUNTER — Other Ambulatory Visit: Payer: Self-pay

## 2020-09-01 ENCOUNTER — Emergency Department (HOSPITAL_COMMUNITY): Payer: No Typology Code available for payment source

## 2020-09-01 DIAGNOSIS — M419 Scoliosis, unspecified: Secondary | ICD-10-CM | POA: Diagnosis present

## 2020-09-01 DIAGNOSIS — R001 Bradycardia, unspecified: Secondary | ICD-10-CM | POA: Diagnosis not present

## 2020-09-01 DIAGNOSIS — R030 Elevated blood-pressure reading, without diagnosis of hypertension: Secondary | ICD-10-CM | POA: Diagnosis present

## 2020-09-01 DIAGNOSIS — Z20822 Contact with and (suspected) exposure to covid-19: Secondary | ICD-10-CM | POA: Diagnosis present

## 2020-09-01 DIAGNOSIS — N201 Calculus of ureter: Secondary | ICD-10-CM

## 2020-09-01 DIAGNOSIS — N132 Hydronephrosis with renal and ureteral calculous obstruction: Principal | ICD-10-CM | POA: Diagnosis present

## 2020-09-01 DIAGNOSIS — D6489 Other specified anemias: Secondary | ICD-10-CM | POA: Diagnosis present

## 2020-09-01 DIAGNOSIS — R7989 Other specified abnormal findings of blood chemistry: Secondary | ICD-10-CM

## 2020-09-01 DIAGNOSIS — N179 Acute kidney failure, unspecified: Secondary | ICD-10-CM | POA: Diagnosis present

## 2020-09-01 DIAGNOSIS — N200 Calculus of kidney: Secondary | ICD-10-CM

## 2020-09-01 DIAGNOSIS — K219 Gastro-esophageal reflux disease without esophagitis: Secondary | ICD-10-CM | POA: Diagnosis not present

## 2020-09-01 DIAGNOSIS — Z87442 Personal history of urinary calculi: Secondary | ICD-10-CM

## 2020-09-01 DIAGNOSIS — R310 Gross hematuria: Secondary | ICD-10-CM | POA: Diagnosis not present

## 2020-09-01 DIAGNOSIS — G8929 Other chronic pain: Secondary | ICD-10-CM | POA: Diagnosis present

## 2020-09-01 LAB — SURGICAL PCR SCREEN
MRSA, PCR: NEGATIVE
Staphylococcus aureus: NEGATIVE

## 2020-09-01 LAB — CBC WITH DIFFERENTIAL/PLATELET
Abs Immature Granulocytes: 0.03 10*3/uL (ref 0.00–0.07)
Basophils Absolute: 0 10*3/uL (ref 0.0–0.1)
Basophils Relative: 0 %
Eosinophils Absolute: 0 10*3/uL (ref 0.0–0.5)
Eosinophils Relative: 0 %
HCT: 41.1 % (ref 39.0–52.0)
Hemoglobin: 13.7 g/dL (ref 13.0–17.0)
Immature Granulocytes: 0 %
Lymphocytes Relative: 8 %
Lymphs Abs: 0.8 10*3/uL (ref 0.7–4.0)
MCH: 29.7 pg (ref 26.0–34.0)
MCHC: 33.3 g/dL (ref 30.0–36.0)
MCV: 89.2 fL (ref 80.0–100.0)
Monocytes Absolute: 0.5 10*3/uL (ref 0.1–1.0)
Monocytes Relative: 5 %
Neutro Abs: 9.2 10*3/uL — ABNORMAL HIGH (ref 1.7–7.7)
Neutrophils Relative %: 87 %
Platelets: 399 10*3/uL (ref 150–400)
RBC: 4.61 MIL/uL (ref 4.22–5.81)
RDW: 14.7 % (ref 11.5–15.5)
WBC: 10.5 10*3/uL (ref 4.0–10.5)
nRBC: 0 % (ref 0.0–0.2)

## 2020-09-01 LAB — URINALYSIS, ROUTINE W REFLEX MICROSCOPIC
Bacteria, UA: NONE SEEN
Bilirubin Urine: NEGATIVE
Glucose, UA: NEGATIVE mg/dL
Ketones, ur: 5 mg/dL — AB
Leukocytes,Ua: NEGATIVE
Nitrite: NEGATIVE
Protein, ur: 100 mg/dL — AB
Specific Gravity, Urine: 1.024 (ref 1.005–1.030)
pH: 5 (ref 5.0–8.0)

## 2020-09-01 LAB — COMPREHENSIVE METABOLIC PANEL
ALT: 18 U/L (ref 0–44)
AST: 20 U/L (ref 15–41)
Albumin: 4.6 g/dL (ref 3.5–5.0)
Alkaline Phosphatase: 69 U/L (ref 38–126)
Anion gap: 11 (ref 5–15)
BUN: 29 mg/dL — ABNORMAL HIGH (ref 6–20)
CO2: 25 mmol/L (ref 22–32)
Calcium: 9.9 mg/dL (ref 8.9–10.3)
Chloride: 109 mmol/L (ref 98–111)
Creatinine, Ser: 2 mg/dL — ABNORMAL HIGH (ref 0.61–1.24)
GFR, Estimated: 38 mL/min — ABNORMAL LOW (ref >60)
Glucose, Bld: 127 mg/dL — ABNORMAL HIGH (ref 70–99)
Potassium: 3.6 mmol/L (ref 3.5–5.1)
Sodium: 145 mmol/L (ref 135–145)
Total Bilirubin: 1.1 mg/dL (ref 0.3–1.2)
Total Protein: 8.7 g/dL — ABNORMAL HIGH (ref 6.5–8.1)

## 2020-09-01 LAB — RESP PANEL BY RT-PCR (FLU A&B, COVID) ARPGX2
Influenza A by PCR: NEGATIVE
Influenza B by PCR: NEGATIVE
SARS Coronavirus 2 by RT PCR: NEGATIVE

## 2020-09-01 LAB — LIPASE, BLOOD: Lipase: 30 U/L (ref 11–51)

## 2020-09-01 MED ORDER — LACTATED RINGERS IV SOLN: INTRAVENOUS | Status: DC

## 2020-09-01 MED ORDER — METOCLOPRAMIDE HCL 5 MG/ML IJ SOLN
5.0000 mg | Freq: Once | INTRAMUSCULAR | Status: AC
  Administered 2020-09-01: 5 mg via INTRAVENOUS
  Filled 2020-09-01: qty 2

## 2020-09-01 MED ORDER — ONDANSETRON HCL 4 MG/2ML IJ SOLN
4.0000 mg | Freq: Four times a day (QID) | INTRAMUSCULAR | Status: DC | PRN
  Administered 2020-09-01 – 2020-09-03 (×6): 4 mg via INTRAVENOUS
  Filled 2020-09-01 (×6): qty 2

## 2020-09-01 MED ORDER — MORPHINE SULFATE (PF) 4 MG/ML IV SOLN
4.0000 mg | Freq: Once | INTRAVENOUS | Status: AC
Start: 2020-09-01 — End: 2020-09-01
  Administered 2020-09-01: 4 mg via INTRAVENOUS
  Filled 2020-09-01: qty 1

## 2020-09-01 MED ORDER — SODIUM CHLORIDE 0.9 % IV SOLN: INTRAVENOUS | Status: DC

## 2020-09-01 MED ORDER — HYDROMORPHONE HCL 1 MG/ML IJ SOLN
0.5000 mg | INTRAMUSCULAR | Status: DC | PRN
  Administered 2020-09-02 – 2020-09-03 (×2): 1 mg via INTRAVENOUS
  Filled 2020-09-01 (×2): qty 1

## 2020-09-01 MED ORDER — DOCUSATE SODIUM 100 MG PO CAPS
100.0000 mg | ORAL_CAPSULE | Freq: Two times a day (BID) | ORAL | Status: DC
  Administered 2020-09-01 – 2020-09-03 (×3): 100 mg via ORAL
  Filled 2020-09-01 (×3): qty 1

## 2020-09-01 MED ORDER — ONDANSETRON HCL 4 MG/2ML IJ SOLN: 4.0000 mg | Freq: Three times a day (TID) | INTRAMUSCULAR | Status: DC | PRN

## 2020-09-01 MED ORDER — POLYETHYLENE GLYCOL 3350 17 G PO PACK: 17.0000 g | PACK | Freq: Every day | ORAL | Status: DC | PRN

## 2020-09-01 MED ORDER — LACTATED RINGERS IV BOLUS
2000.0000 mL | Freq: Once | INTRAVENOUS | Status: AC
  Administered 2020-09-01: 2000 mL via INTRAVENOUS

## 2020-09-01 MED ORDER — PANTOPRAZOLE SODIUM 40 MG PO TBEC
40.0000 mg | DELAYED_RELEASE_TABLET | Freq: Every day | ORAL | Status: DC
  Administered 2020-09-03: 40 mg via ORAL
  Filled 2020-09-01: qty 1

## 2020-09-01 MED ORDER — PANTOPRAZOLE SODIUM 40 MG IV SOLR
40.0000 mg | Freq: Once | INTRAVENOUS | Status: AC
  Administered 2020-09-01: 40 mg via INTRAVENOUS
  Filled 2020-09-01: qty 40

## 2020-09-01 NOTE — ED Triage Notes (Signed)
Pt presents with c/o vomiting and right flank pain for several days. Pt reports he has been told in the past that his kidneys were not functioning properly.

## 2020-09-01 NOTE — ED Notes (Signed)
   09/01/20 1317  Vitals  Pulse Rate (!) 45  ECG Heart Rate (!) 44  Resp 10  MEWS COLOR  MEWS Score Color Yellow  Oxygen Therapy  SpO2 100 %  MEWS Score  MEWS Temp 0  MEWS Systolic 0  MEWS Pulse 1  MEWS RR 1  MEWS LOC 0  MEWS Score 2  Allen, EDP aware of patient HR in the 40-50s, still wants patient to receive IV morphine.

## 2020-09-01 NOTE — H&P (Signed)
History and Physical    Eric Farrell OEU:[id number] DOB: 12/06/1960 DOA: 09/01/2020  PCP: [facility] Patient coming from: Home  Chief Complaint: Right back pain  HPI: Eric Farrell is a 60 y.o. male with history of kidney stone, GERD, osteoarthritis and chronic chest pain presenting with right back pain.  Patient was seen in ED on 08/15/2020 with right flank pain.  At that time, he had a CT renal stone study that showed about 6 mm nonobstructing nephrolithiasis.  He was discharged from ED.  Patient developed right-sided back pain about 2 days ago.  He describes his pain as severe.  Pain is on and off.  Radiation to his right flank.  He also reports dark urine.  He denies dysuria but increased frequency of urination.  He has associated nausea and emesis.  He reports about 7 episodes of emesis today.  Emesis was nonbloody.  He denies fever but reports chills.  He reports chronic chest pain that he attributes to his workouts when he was in [employer].  He denies acute changes to his chest pain.  He denies shortness of breath but felt a little lightheaded over the last 2 days.  He denies passing out.  He denies NSAID use.  He denies history of diabetes, heart disease, lung disease or stroke.  He denies smoking cigarettes, drinking alcohol or recreational drug use other than occasional marijuana.  He prefers to remain full code.  In ED, bradycardic in 40s and 50s but improved to 60s and 70s with exertion.  Slightly elevated BP.  Afebrile.  Normal saturation on RA.  CMP without significant finding other than Cr off 2.0 and BUN of 29 (was 1.51 on 4/1).  CBC without significant finding.  UA with moderate Hgb and 11-20 RBCs.  CT renal stone study demonstrated 7 mm obstructing right UPJ stone.  Patient was started on IV fluid, antiemetics and analgesics.  Urology consulted.  Hospitalist service called for admission.  ROS All review of system negative except for pertinent positives and negatives as  history of present illness above.  PMH Past Medical History:  Diagnosis Date  . Arthritis   . GERD (gastroesophageal reflux disease)   . Kidney stones   . Scoliosis    PSH Past Surgical History:  Procedure Laterality Date  . COLONOSCOPY     Fam HX Family History  Problem Relation Age of Onset  . Cancer Father   . Colon cancer Father   . Hypertension Other   . Esophageal cancer Neg Hx   . Stomach cancer Neg Hx   . Rectal cancer Neg Hx     Social Hx  reports that he has never smoked. He has never used smokeless tobacco. He reports current drug use. Drug: Marijuana. He reports that he does not drink alcohol.  Allergy No Known Allergies Home Meds Prior to Admission medications   Medication Sig Start Date End Date Taking? Authorizing Provider  pantoprazole (PROTONIX) 40 MG tablet Take 40 mg by mouth daily. 10/16/19   [provider]  promethazine (PHENERGAN) 25 MG tablet Take 1 tablet (25 mg total) by mouth every 6 (six) hours as needed for nausea or vomiting. Patient not taking: Reported on 05/08/2018 01/09/16 10/29/19  Orpah Greek, MD  ranitidine (ZANTAC) 150 MG tablet Take 1 tablet (150 mg total) by mouth 2 (two) times daily. Patient not taking: Reported on 05/08/2018 04/07/17 10/29/19  Frederica Kuster, PA-C  sucralfate (CARAFATE) 1 GM/10ML suspension Take 1 g by mouth 4 (  four) times daily -  with meals and at bedtime.  10/29/19  [provider]    Physical Exam: Vitals:   09/01/20 1400 09/01/20 1430 09/01/20 1500 09/01/20 1530  BP: (!) 168/90 (!) 146/80 (!) 158/83 113/74  Pulse: (!) 46 (!) 45 (!) 45 61  Resp: (!) 21 18 19  (!) 25  Temp:      TempSrc:      SpO2: 100% 100% 100% 100%  Weight:      Height:        GENERAL: No acute distress.  Appears well.  HEENT: MMM.  Vision and hearing grossly intact.  NECK: Supple.  No apparent JVD.  RESP: On RA.  No IWOB. Good air movement bilaterally. CVS: Bradycardic but rose to 80s when he had cough.  Heart sounds normal.  ABD/GI/GU: Bowel sounds present. Soft. Non tender.  No CVA tenderness. MSK/EXT:  Moves extremities. No apparent deformity or edema.  SKIN: no apparent skin lesion or wound NEURO: Awake, alert and oriented appropriately.  No gross deficit.  PSYCH: Calm. Normal affect.   Personally Reviewed Radiological Exams CT Renal Stone Study  Result Date: 09/01/2020 CLINICAL DATA:  Vomiting with right flank pain for several days. Kidney stone suspected. EXAM: CT ABDOMEN AND PELVIS WITHOUT CONTRAST TECHNIQUE: Multidetector CT imaging of the abdomen and pelvis was performed following the standard protocol without IV contrast. COMPARISON:  Abdominopelvic CT 08/15/2020. FINDINGS: Lower chest: Clear lung bases. No significant pleural or pericardial effusion. Hepatobiliary: The liver is normal in density without suspicious focal abnormality. There are grossly stable cysts within the left hepatic lobe. No evidence of gallstones, gallbladder wall thickening or biliary dilatation. Pancreas: Unremarkable. No pancreatic ductal dilatation or surrounding inflammatory changes. Spleen: Normal in size without focal abnormality. Adrenals/Urinary Tract: Both adrenal glands appear normal. The previously demonstrated 7 mm right renal calculus has passed into the proximal right ureter and is visible on the scout image at the level of the right L4 transverse process. This is causing proximal ureteral obstruction, manifesting as proximal collecting system dilatation and mild periureteral soft tissue stranding. No other urinary tract calculi are seen. There is a stable cyst in the upper pole of the left kidney. The bladder appears unremarkable. Stomach/Bowel: No enteric contrast administered. The stomach appears unremarkable for its degree of distension. No evidence of bowel wall thickening, distention or surrounding inflammatory change. The appendix appears normal. Vascular/Lymphatic: There are no enlarged abdominal or  pelvic lymph nodes. Aortic and branch vessel atherosclerosis with tortuosity. No acute vascular findings. Reproductive: The prostate gland appears unremarkable. Other: No evidence of abdominal wall mass or hernia. No ascites. Musculoskeletal: No acute or significant osseous findings. Multilevel spondylosis. IMPRESSION: 1. The previously demonstrated right renal calculus has passed into the proximal ureter and is causing collecting system obstruction. This measures 7 mm and is located at the level of the right L4 transverse process. 2. No other acute or significant findings. No additional urinary tract calculi. 3.  Aortic Atherosclerosis (ICD10-I70.0). Electronically Signed   By: Richardean Sale M.D.   On: 09/01/2020 14:42     Personally Reviewed Labs: CBC: Recent Labs  Lab 09/01/20 1334  WBC 10.5  NEUTROABS 9.2*  HGB 13.7  HCT 41.1  MCV 89.2  PLT 161   Basic Metabolic Panel: Recent Labs  Lab 09/01/20 1334  NA 145  K 3.6  CL 109  CO2 25  GLUCOSE 127*  BUN 29*  CREATININE 2.00*  CALCIUM 9.9   GFR: Estimated Creatinine Clearance: 42.1 mL/min (  A) (by C-G formula based on SCr of 2 mg/dL (H)). Liver Function Tests: Recent Labs  Lab 09/01/20 1334  AST 20  ALT 18  ALKPHOS 69  BILITOT 1.1  PROT 8.7*  ALBUMIN 4.6   Recent Labs  Lab 09/01/20 1334  LIPASE 30   No results for input(s): AMMONIA in the last 168 hours. Coagulation Profile: No results for input(s): INR, PROTIME in the last 168 hours. Cardiac Enzymes: No results for input(s): CKTOTAL, CKMB, CKMBINDEX, TROPONINI in the last 168 hours. BNP (last 3 results) No results for input(s): PROBNP in the last 8760 hours. HbA1C: No results for input(s): HGBA1C in the last 72 hours. CBG: No results for input(s): GLUCAP in the last 168 hours. Lipid Profile: No results for input(s): CHOL, HDL, LDLCALC, TRIG, CHOLHDL, LDLDIRECT in the last 72 hours. Thyroid Function Tests: No results for input(s): TSH, T4TOTAL, FREET4,  T3FREE, THYROIDAB in the last 72 hours. Anemia Panel: No results for input(s): VITAMINB12, FOLATE, FERRITIN, TIBC, IRON, RETICCTPCT in the last 72 hours. Urine analysis:    Component Value Date/Time   COLORURINE YELLOW 08/15/2020 1420   APPEARANCEUR CLEAR 08/15/2020 1420   LABSPEC 1.020 08/15/2020 1420   PHURINE 5.0 08/15/2020 1420   GLUCOSEU NEGATIVE 08/15/2020 1420   HGBUR MODERATE (A) 08/15/2020 1420   BILIRUBINUR NEGATIVE 08/15/2020 1420   KETONESUR NEGATIVE 08/15/2020 1420   PROTEINUR NEGATIVE 08/15/2020 1420   NITRITE NEGATIVE 08/15/2020 1420   LEUKOCYTESUR NEGATIVE 08/15/2020 1420    Sepsis Labs:  None  Personally Reviewed EKG:  Twelve-lead EKG with sinus bradycardia to 48 and possible LVH.  Assessment/Plan Renal colic due to obstructing right UPJ stone-no signs of infection -Plan for right JJ stent by urology tomorrow -Continue IV fluid, analgesics and antiemetics -SCD for VTE prophylaxis -N.p.o. after midnight  Intractable nausea and vomiting-likely due to the above.  Abdominal exam benign.  LFT within normal. -IV fluid and antiemetics as above  AKI/azotemia: Likely due to #1 Recent Labs    10/29/19 0203 08/15/20 1420 08/15/20 1851 09/01/20 1334  BUN 20 44* 34* 29*  CREATININE 1.39* 1.84* 1.51* 2.00*  -IV fluid as above -Avoid nephrotoxic meds   GERD -Continue home PPI  DVT prophylaxis: SCD  Code Status: Full code Family Communication: Patient declined saying he would call Disposition Plan: Admit to [place] called: Urology Admission status: Observation Level of care: Med-Surg   Mercy Riding MD [facility]  If 7PM-7AM, please contact night-coverage [email or web address]  09/01/2020, 3:39 PM

## 2020-09-01 NOTE — ED Provider Notes (Signed)
[facility] DEPT Provider Note   CSN: [id number] Arrival date & time: 09/01/20  1208     History Chief Complaint  Patient presents with  . Emesis  . Flank Pain    Eric Farrell is a 60 y.o. male.  60 year old male presents with recurring epigastric abdominal pain x2 days.  States that it is caused him not to have bilateral flank pain as well as dark urine.  States he has a history of chronic kidney disease and that when he has his chronic abdominal pain exacerbations he tends to have increased creatinine.  No fever or chills.  Denies any diarrhea.  No treatment use prior to arrival        Past Medical History:  Diagnosis Date  . Arthritis   . GERD (gastroesophageal reflux disease)   . Kidney stones   . Scoliosis     There are no problems to display for this patient.   Past Surgical History:  Procedure Laterality Date  . COLONOSCOPY         Family History  Problem Relation Age of Onset  . Cancer Father   . Colon cancer Father   . Hypertension Other   . Esophageal cancer Neg Hx   . Stomach cancer Neg Hx   . Rectal cancer Neg Hx     Social History   Tobacco Use  . Smoking status: Never Smoker  . Smokeless tobacco: Never Used  Vaping Use  . Vaping Use: Never used  Substance Use Topics  . Alcohol use: No  . Drug use: Yes    Types: Marijuana    Comment: last used 1/2 joint last night    Home Medications Prior to Admission medications   Medication Sig Start Date End Date Taking? Authorizing Provider  pantoprazole (PROTONIX) 40 MG tablet Take 40 mg by mouth daily. 10/16/19   [provider]  promethazine (PHENERGAN) 25 MG tablet Take 1 tablet (25 mg total) by mouth every 6 (six) hours as needed for nausea or vomiting. Patient not taking: Reported on 05/08/2018 01/09/16 10/29/19  Orpah Greek, MD  ranitidine (ZANTAC) 150 MG tablet Take 1 tablet (150 mg total) by mouth 2 (two) times daily. Patient not taking:  Reported on 05/08/2018 04/07/17 10/29/19  Frederica Kuster, PA-C  sucralfate (CARAFATE) 1 GM/10ML suspension Take 1 g by mouth 4 (four) times daily -  with meals and at bedtime.  10/29/19  [provider]    Allergies    Patient has no known allergies.  Review of Systems   Review of Systems  All other systems reviewed and are negative.   Physical Exam Updated Vital Signs BP (!) 163/91 (BP Location: Left Arm)   Pulse (!) 50   Temp 98.4 F (36.9 C) (Oral)   Resp 16   Ht 1.778 m (5\' 10" )   Wt 77.1 kg   SpO2 100%   BMI 24.39 kg/m   Physical Exam Vitals and nursing note reviewed.  Constitutional:      General: He is not in acute distress.    Appearance: Normal appearance. He is well-developed. He is not toxic-appearing.  HENT:     Head: Normocephalic and atraumatic.  Eyes:     General: Lids are normal.     Conjunctiva/sclera: Conjunctivae normal.     Pupils: Pupils are equal, round, and reactive to light.  Neck:     Thyroid: No thyroid mass.     Trachea: No tracheal deviation.  Cardiovascular:  Rate and Rhythm: Normal rate and regular rhythm.     Heart sounds: Normal heart sounds. No murmur heard. No gallop.   Pulmonary:     Effort: Pulmonary effort is normal. No respiratory distress.     Breath sounds: Normal breath sounds. No stridor. No decreased breath sounds, wheezing, rhonchi or rales.  Abdominal:     General: Bowel sounds are normal. There is no distension.     Palpations: Abdomen is soft.     Tenderness: There is no abdominal tenderness. There is no rebound.  Musculoskeletal:        General: No tenderness. Normal range of motion.     Cervical back: Normal range of motion and neck supple.  Skin:    General: Skin is warm and dry.     Findings: No abrasion or rash.  Neurological:     Mental Status: He is alert and oriented to person, place, and time.     GCS: GCS eye subscore is 4. GCS verbal subscore is 5. GCS motor subscore is 6.     Cranial  Nerves: No cranial nerve deficit.     Sensory: No sensory deficit.  Psychiatric:        Speech: Speech normal.        Behavior: Behavior normal.     ED Results / Procedures / Treatments   Labs (all labs ordered are listed, but only abnormal results are displayed) Labs Reviewed  URINE CULTURE  CBC WITH DIFFERENTIAL/PLATELET  COMPREHENSIVE METABOLIC PANEL  LIPASE, BLOOD  URINALYSIS, ROUTINE W REFLEX MICROSCOPIC    EKG None  Radiology No results found.  Procedures Procedures   Medications Ordered in ED Medications  0.9 %  sodium chloride infusion (has no administration in time range)  lactated ringers infusion (has no administration in time range)  lactated ringers bolus 2,000 mL (has no administration in time range)  metoCLOPramide (REGLAN) injection 5 mg (has no administration in time range)  morphine 4 MG/ML injection 4 mg (has no administration in time range)  pantoprazole (PROTONIX) injection 40 mg (has no administration in time range)    ED Course  I have reviewed the triage vital signs and the nursing notes.  Pertinent labs & imaging results that were available during my care of the patient were reviewed by me and considered in my medical decision making (see chart for details).    MDM Rules/Calculators/A&P                          Patient given medication for pain as well as IV fluids and antiemetics.  Creatinine is now gone up to 2.  CT scan shows evidence of a 7 mm kidney stone in the proximal ureter.  Urology consult obtained for this.  Patient will require admission Final Clinical Impression(s) / ED Diagnoses Final diagnoses:  None    Rx / DC Orders ED Discharge Orders    None       Lacretia Leigh, MD 09/01/20 1519

## 2020-09-01 NOTE — Plan of Care (Signed)
Plan of care reviewed with pt. Pt verbalizes understanding and cooperation. Pt safety maintained.   Problem: Education: Goal: Knowledge of General Education information will improve Description: Including pain rating scale, medication(s)/side effects and non-pharmacologic comfort measures Outcome: Progressing   Problem: Health Behavior/Discharge Planning: Goal: Ability to manage health-related needs will improve Outcome: Progressing   Problem: Clinical Measurements: Goal: Ability to maintain clinical measurements within normal limits will improve Outcome: Progressing Goal: Will remain free from infection Outcome: Progressing Goal: Diagnostic test results will improve Outcome: Progressing Goal: Respiratory complications will improve Outcome: Progressing Goal: Cardiovascular complication will be avoided Outcome: Progressing   Problem: Activity: Goal: Risk for activity intolerance will decrease Outcome: Progressing   Problem: Nutrition: Goal: Adequate nutrition will be maintained Outcome: Progressing   Problem: Coping: Goal: Level of anxiety will decrease Outcome: Progressing   Problem: Elimination: Goal: Will not experience complications related to bowel motility Outcome: Progressing Goal: Will not experience complications related to urinary retention Outcome: Progressing   Problem: Pain Managment: Goal: General experience of comfort will improve Outcome: Progressing   Problem: Safety: Goal: Ability to remain free from injury will improve Outcome: Progressing   Problem: Skin Integrity: Goal: Risk for impaired skin integrity will decrease Outcome: Progressing

## 2020-09-01 NOTE — Consult Note (Addendum)
Urology Consult   Physician requesting consult: Lacretia Leigh, MD  Reason for consult: Ureteral stone  History of Present Illness: Eric Farrell is a 60 y.o. male with a history of chronic kidney disease and kidney stones.  He presented to the emergency department today with a 48-hour history of worsening sharp, intermittent, nonradiating right-sided flank pain.  He denies any associated fever/chills, but does report nausea and emesis for the past 2 days.  CT stone study from today revealed an obstructing 7 mm right UPJ calculus with mild to moderate hydronephrosis.  He has passed multiple kidney stones in the past, but has never required surgery.  Currently, his pain is well managed and he denies any nausea.  The patient denies a history of voiding or storage urinary symptoms, hematuria, UTIs, STDs, GU malignancy/trauma/surgery.  Past Medical History:  Diagnosis Date  . Arthritis   . GERD (gastroesophageal reflux disease)   . Kidney stones   . Scoliosis     Past Surgical History:  Procedure Laterality Date  . COLONOSCOPY      Current Hospital Medications:  Home Meds:  No outpatient medications have been marked as taking for the 09/01/20 encounter [facility] Encounter).    Scheduled Meds: Continuous Infusions: . lactated ringers     PRN Meds:.ondansetron (ZOFRAN) IV  Allergies: No Known Allergies  Family History  Problem Relation Age of Onset  . Cancer Father   . Colon cancer Father   . Hypertension Other   . Esophageal cancer Neg Hx   . Stomach cancer Neg Hx   . Rectal cancer Neg Hx     Social History:  reports that he has never smoked. He has never used smokeless tobacco. He reports current drug use. Drug: Marijuana. He reports that he does not drink alcohol.  ROS: A complete review of systems was performed.  All systems are negative except for pertinent findings as noted.  Physical Exam:  Vital signs in last 24 hours: Temp:  [98.4 F (36.9 C)-98.7 F (37.1 C)]  98.7 F (37.1 C) (04/18 1313) Pulse Rate:  [42-62] 57 (04/18 1545) Resp:  [10-25] 23 (04/18 1545) BP: (113-178)/(74-91) 113/74 (04/18 1530) SpO2:  [98 %-100 %] 99 % (04/18 1545) Weight:  [77.1 kg] 77.1 kg (04/18 1220) Constitutional:  Alert and oriented, No acute distress Cardiovascular: Regular rate and rhythm, No JVD Respiratory: Normal respiratory effort, Lungs clear bilaterally GI: Abdomen is soft, nontender, nondistended, no abdominal masses GU: No CVA tenderness Lymphatic: No lymphadenopathy Neurologic: Grossly intact, no focal deficits Psychiatric: Normal mood and affect  Laboratory Data:  Recent Labs    09/01/20 1334  WBC 10.5  HGB 13.7  HCT 41.1  PLT 399    Recent Labs    09/01/20 1334  NA 145  K 3.6  CL 109  GLUCOSE 127*  BUN 29*  CALCIUM 9.9  CREATININE 2.00*     Results for orders placed or performed during the hospital encounter of 09/01/20 (from the past 24 hour(s))  CBC with Differential/Platelet     Status: Abnormal   Collection Time: 09/01/20  1:34 PM  Result Value Ref Range   WBC 10.5 4.0 - 10.5 K/uL   RBC 4.61 4.22 - 5.81 MIL/uL   Hemoglobin 13.7 13.0 - 17.0 g/dL   HCT 41.1 39.0 - 52.0 %   MCV 89.2 80.0 - 100.0 fL   MCH 29.7 26.0 - 34.0 pg   MCHC 33.3 30.0 - 36.0 g/dL   RDW 14.7 11.5 - 15.5 %  Platelets 399 150 - 400 K/uL   nRBC 0.0 0.0 - 0.2 %   Neutrophils Relative % 87 %   Neutro Abs 9.2 (H) 1.7 - 7.7 K/uL   Lymphocytes Relative 8 %   Lymphs Abs 0.8 0.7 - 4.0 K/uL   Monocytes Relative 5 %   Monocytes Absolute 0.5 0.1 - 1.0 K/uL   Eosinophils Relative 0 %   Eosinophils Absolute 0.0 0.0 - 0.5 K/uL   Basophils Relative 0 %   Basophils Absolute 0.0 0.0 - 0.1 K/uL   Immature Granulocytes 0 %   Abs Immature Granulocytes 0.03 0.00 - 0.07 K/uL  Comprehensive metabolic panel     Status: Abnormal   Collection Time: 09/01/20  1:34 PM  Result Value Ref Range   Sodium 145 135 - 145 mmol/L   Potassium 3.6 3.5 - 5.1 mmol/L   Chloride 109  98 - 111 mmol/L   CO2 25 22 - 32 mmol/L   Glucose, Bld 127 (H) 70 - 99 mg/dL   BUN 29 (H) 6 - 20 mg/dL   Creatinine, Ser 2.00 (H) 0.61 - 1.24 mg/dL   Calcium 9.9 8.9 - 10.3 mg/dL   Total Protein 8.7 (H) 6.5 - 8.1 g/dL   Albumin 4.6 3.5 - 5.0 g/dL   AST 20 15 - 41 U/L   ALT 18 0 - 44 U/L   Alkaline Phosphatase 69 38 - 126 U/L   Total Bilirubin 1.1 0.3 - 1.2 mg/dL   GFR, Estimated 38 (L) >60 mL/min   Anion gap 11 5 - 15  Lipase, blood     Status: None   Collection Time: 09/01/20  1:34 PM  Result Value Ref Range   Lipase 30 11 - 51 U/L   No results found for this or any previous visit (from the past 240 hour(s)).  Renal Function: Recent Labs    09/01/20 1334  CREATININE 2.00*   Estimated Creatinine Clearance: 42.1 mL/min (A) (by C-G formula based on SCr of 2 mg/dL (H)).  Radiologic Imaging: CT Renal Stone Study  Result Date: 09/01/2020 CLINICAL DATA:  Vomiting with right flank pain for several days. Kidney stone suspected. EXAM: CT ABDOMEN AND PELVIS WITHOUT CONTRAST TECHNIQUE: Multidetector CT imaging of the abdomen and pelvis was performed following the standard protocol without IV contrast. COMPARISON:  Abdominopelvic CT 08/15/2020. FINDINGS: Lower chest: Clear lung bases. No significant pleural or pericardial effusion. Hepatobiliary: The liver is normal in density without suspicious focal abnormality. There are grossly stable cysts within the left hepatic lobe. No evidence of gallstones, gallbladder wall thickening or biliary dilatation. Pancreas: Unremarkable. No pancreatic ductal dilatation or surrounding inflammatory changes. Spleen: Normal in size without focal abnormality. Adrenals/Urinary Tract: Both adrenal glands appear normal. The previously demonstrated 7 mm right renal calculus has passed into the proximal right ureter and is visible on the scout image at the level of the right L4 transverse process. This is causing proximal ureteral obstruction, manifesting as proximal  collecting system dilatation and mild periureteral soft tissue stranding. No other urinary tract calculi are seen. There is a stable cyst in the upper pole of the left kidney. The bladder appears unremarkable. Stomach/Bowel: No enteric contrast administered. The stomach appears unremarkable for its degree of distension. No evidence of bowel wall thickening, distention or surrounding inflammatory change. The appendix appears normal. Vascular/Lymphatic: There are no enlarged abdominal or pelvic lymph nodes. Aortic and branch vessel atherosclerosis with tortuosity. No acute vascular findings. Reproductive: The prostate gland appears unremarkable. Other: No evidence  of abdominal wall mass or hernia. No ascites. Musculoskeletal: No acute or significant osseous findings. Multilevel spondylosis. IMPRESSION: 1. The previously demonstrated right renal calculus has passed into the proximal ureter and is causing collecting system obstruction. This measures 7 mm and is located at the level of the right L4 transverse process. 2. No other acute or significant findings. No additional urinary tract calculi. 3.  Aortic Atherosclerosis (ICD10-I70.0). Electronically Signed   By: Richardean Sale M.D.   On: 09/01/2020 14:42    I independently reviewed the above imaging studies.  Impression/Recommendation 60 year old male with an obstructing 7 mm right UPJ calculus with mild right-sided hydronephrosis associated with AKI.  -The risks, benefits and alternatives of cystoscopy with RIGHT JJ stent placement was discussed with the patient.  Risks include, but are not limited to: bleeding, urinary tract infection, ureteral injury, ureteral stricture disease, chronic pain, urinary symptoms, bladder injury, stent migration, the need for nephrostomy tube placement, MI, CVA, DVT, PE and the inherent risks with general anesthesia.  The patient voices understanding and wishes to proceed.   -Plan for stent placement tomorrow  afternoon  Ellison Hughs, MD [facility] 09/01/2020, 3:58 PM

## 2020-09-02 ENCOUNTER — Observation Stay (HOSPITAL_COMMUNITY): Payer: No Typology Code available for payment source | Admitting: Certified Registered Nurse Anesthetist

## 2020-09-02 ENCOUNTER — Observation Stay (HOSPITAL_COMMUNITY): Payer: No Typology Code available for payment source

## 2020-09-02 ENCOUNTER — Encounter (HOSPITAL_COMMUNITY): Payer: Self-pay | Admitting: Student

## 2020-09-02 ENCOUNTER — Encounter (HOSPITAL_COMMUNITY)
Admission: EM | Disposition: A | Discharge status: Home / Self Care | Payer: Self-pay | Source: Home / Self Care | Attending: Student

## 2020-09-02 DIAGNOSIS — N201 Calculus of ureter: Secondary | ICD-10-CM | POA: Diagnosis not present

## 2020-09-02 DIAGNOSIS — K219 Gastro-esophageal reflux disease without esophagitis: Secondary | ICD-10-CM | POA: Diagnosis present

## 2020-09-02 DIAGNOSIS — R030 Elevated blood-pressure reading, without diagnosis of hypertension: Secondary | ICD-10-CM | POA: Diagnosis present

## 2020-09-02 DIAGNOSIS — D649 Anemia, unspecified: Secondary | ICD-10-CM | POA: Diagnosis not present

## 2020-09-02 DIAGNOSIS — R310 Gross hematuria: Secondary | ICD-10-CM | POA: Diagnosis not present

## 2020-09-02 DIAGNOSIS — D6489 Other specified anemias: Secondary | ICD-10-CM | POA: Diagnosis present

## 2020-09-02 DIAGNOSIS — M419 Scoliosis, unspecified: Secondary | ICD-10-CM | POA: Diagnosis present

## 2020-09-02 DIAGNOSIS — R7989 Other specified abnormal findings of blood chemistry: Secondary | ICD-10-CM | POA: Diagnosis not present

## 2020-09-02 DIAGNOSIS — R001 Bradycardia, unspecified: Secondary | ICD-10-CM | POA: Diagnosis not present

## 2020-09-02 DIAGNOSIS — N132 Hydronephrosis with renal and ureteral calculous obstruction: Secondary | ICD-10-CM | POA: Diagnosis present

## 2020-09-02 DIAGNOSIS — Z20822 Contact with and (suspected) exposure to covid-19: Secondary | ICD-10-CM | POA: Diagnosis present

## 2020-09-02 DIAGNOSIS — G8929 Other chronic pain: Secondary | ICD-10-CM | POA: Diagnosis present

## 2020-09-02 DIAGNOSIS — Z87442 Personal history of urinary calculi: Secondary | ICD-10-CM | POA: Diagnosis not present

## 2020-09-02 DIAGNOSIS — N179 Acute kidney failure, unspecified: Secondary | ICD-10-CM | POA: Diagnosis present

## 2020-09-02 DIAGNOSIS — N200 Calculus of kidney: Secondary | ICD-10-CM | POA: Diagnosis not present

## 2020-09-02 HISTORY — PX: CYSTOSCOPY W/ URETERAL STENT PLACEMENT: SHX1429

## 2020-09-02 LAB — CBC
HCT: 33.6 % — ABNORMAL LOW (ref 39.0–52.0)
Hemoglobin: 11 g/dL — ABNORMAL LOW (ref 13.0–17.0)
MCH: 29.5 pg (ref 26.0–34.0)
MCHC: 32.7 g/dL (ref 30.0–36.0)
MCV: 90.1 fL (ref 80.0–100.0)
Platelets: 297 10*3/uL (ref 150–400)
RBC: 3.73 MIL/uL — ABNORMAL LOW (ref 4.22–5.81)
RDW: 14.7 % (ref 11.5–15.5)
WBC: 10.5 10*3/uL (ref 4.0–10.5)
nRBC: 0 % (ref 0.0–0.2)

## 2020-09-02 LAB — COMPREHENSIVE METABOLIC PANEL
ALT: 16 U/L (ref 0–44)
AST: 18 U/L (ref 15–41)
Albumin: 3.4 g/dL — ABNORMAL LOW (ref 3.5–5.0)
Alkaline Phosphatase: 55 U/L (ref 38–126)
Anion gap: 7 (ref 5–15)
BUN: 23 mg/dL — ABNORMAL HIGH (ref 6–20)
CO2: 28 mmol/L (ref 22–32)
Calcium: 9.1 mg/dL (ref 8.9–10.3)
Chloride: 110 mmol/L (ref 98–111)
Creatinine, Ser: 1.48 mg/dL — ABNORMAL HIGH (ref 0.61–1.24)
GFR, Estimated: 55 mL/min — ABNORMAL LOW (ref >60)
Glucose, Bld: 118 mg/dL — ABNORMAL HIGH (ref 70–99)
Potassium: 3.9 mmol/L (ref 3.5–5.1)
Sodium: 145 mmol/L (ref 135–145)
Total Bilirubin: 1.2 mg/dL (ref 0.3–1.2)
Total Protein: 6.6 g/dL (ref 6.5–8.1)

## 2020-09-02 SURGERY — CYSTOSCOPY, WITH RETROGRADE PYELOGRAM AND URETERAL STENT INSERTION
Anesthesia: General | Site: Bladder | Laterality: Right

## 2020-09-02 MED ORDER — ONDANSETRON HCL 4 MG/2ML IJ SOLN
INTRAMUSCULAR | Status: AC
  Filled 2020-09-02: qty 2

## 2020-09-02 MED ORDER — SUCCINYLCHOLINE CHLORIDE 200 MG/10ML IV SOSY
PREFILLED_SYRINGE | INTRAVENOUS | Status: AC
  Filled 2020-09-02: qty 10

## 2020-09-02 MED ORDER — OXYCODONE HCL 5 MG PO TABS
5.0000 mg | ORAL_TABLET | Freq: Once | ORAL | Status: DC | PRN
Start: 2020-09-02 — End: 2020-09-02

## 2020-09-02 MED ORDER — FENTANYL CITRATE (PF) 100 MCG/2ML IJ SOLN
INTRAMUSCULAR | Status: DC | PRN
  Administered 2020-09-02 (×2): 50 ug via INTRAVENOUS

## 2020-09-02 MED ORDER — FENTANYL CITRATE (PF) 100 MCG/2ML IJ SOLN: 25.0000 ug | INTRAMUSCULAR | Status: DC | PRN

## 2020-09-02 MED ORDER — ACETAMINOPHEN 160 MG/5ML PO SOLN: 325.0000 mg | ORAL | Status: DC | PRN

## 2020-09-02 MED ORDER — ONDANSETRON HCL 4 MG/2ML IJ SOLN: 4.0000 mg | Freq: Once | INTRAMUSCULAR | Status: DC | PRN

## 2020-09-02 MED ORDER — PROPOFOL 10 MG/ML IV BOLUS
INTRAVENOUS | Status: AC
  Filled 2020-09-02: qty 20

## 2020-09-02 MED ORDER — DEXAMETHASONE SODIUM PHOSPHATE 10 MG/ML IJ SOLN
INTRAMUSCULAR | Status: AC
  Filled 2020-09-02: qty 1

## 2020-09-02 MED ORDER — IOHEXOL 300 MG/ML  SOLN
INTRAMUSCULAR | Status: DC | PRN
  Administered 2020-09-02: 8 mL

## 2020-09-02 MED ORDER — MEPERIDINE HCL 50 MG/ML IJ SOLN: 6.2500 mg | INTRAMUSCULAR | Status: DC | PRN

## 2020-09-02 MED ORDER — LIDOCAINE HCL (CARDIAC) PF 100 MG/5ML IV SOSY
PREFILLED_SYRINGE | INTRAVENOUS | Status: DC | PRN
  Administered 2020-09-02: 60 mg via INTRAVENOUS

## 2020-09-02 MED ORDER — ONDANSETRON HCL 4 MG/2ML IJ SOLN
INTRAMUSCULAR | Status: DC | PRN
  Administered 2020-09-02: 4 mg via INTRAVENOUS

## 2020-09-02 MED ORDER — FENTANYL CITRATE (PF) 100 MCG/2ML IJ SOLN
INTRAMUSCULAR | Status: AC
  Filled 2020-09-02: qty 2

## 2020-09-02 MED ORDER — CEFAZOLIN SODIUM-DEXTROSE 2-4 GM/100ML-% IV SOLN
INTRAVENOUS | Status: AC
  Filled 2020-09-02: qty 100

## 2020-09-02 MED ORDER — LIDOCAINE 2% (20 MG/ML) 5 ML SYRINGE
INTRAMUSCULAR | Status: AC
  Filled 2020-09-02: qty 5

## 2020-09-02 MED ORDER — ACETAMINOPHEN 325 MG PO TABS: 325.0000 mg | ORAL_TABLET | ORAL | Status: DC | PRN

## 2020-09-02 MED ORDER — OXYCODONE HCL 5 MG/5ML PO SOLN: 5.0000 mg | Freq: Once | ORAL | Status: DC | PRN

## 2020-09-02 MED ORDER — CEFAZOLIN SODIUM-DEXTROSE 2-3 GM-%(50ML) IV SOLR
INTRAVENOUS | Status: DC | PRN
  Administered 2020-09-02: 2 g via INTRAVENOUS

## 2020-09-02 MED ORDER — DEXAMETHASONE SODIUM PHOSPHATE 10 MG/ML IJ SOLN
INTRAMUSCULAR | Status: DC | PRN
  Administered 2020-09-02: 8 mg via INTRAVENOUS

## 2020-09-02 MED ORDER — SODIUM CHLORIDE 0.9 % IR SOLN
Status: DC | PRN
  Administered 2020-09-02: 3000 mL

## 2020-09-02 MED ORDER — MIDAZOLAM HCL 2 MG/2ML IJ SOLN
INTRAMUSCULAR | Status: DC | PRN
  Administered 2020-09-02: 2 mg via INTRAVENOUS

## 2020-09-02 MED ORDER — PROCHLORPERAZINE EDISYLATE 10 MG/2ML IJ SOLN
5.0000 mg | Freq: Once | INTRAMUSCULAR | Status: AC | PRN
  Administered 2020-09-02: 5 mg via INTRAVENOUS
  Filled 2020-09-02: qty 2

## 2020-09-02 MED ORDER — SUCCINYLCHOLINE CHLORIDE 200 MG/10ML IV SOSY
PREFILLED_SYRINGE | INTRAVENOUS | Status: DC | PRN
  Administered 2020-09-02: 140 mg via INTRAVENOUS

## 2020-09-02 MED ORDER — MIDAZOLAM HCL 2 MG/2ML IJ SOLN
INTRAMUSCULAR | Status: AC
  Filled 2020-09-02: qty 2

## 2020-09-02 MED ORDER — PROPOFOL 10 MG/ML IV BOLUS
INTRAVENOUS | Status: DC | PRN
  Administered 2020-09-02: 160 mg via INTRAVENOUS

## 2020-09-02 MED ORDER — TRAMADOL HCL 50 MG PO TABS: 50.0000 mg | ORAL_TABLET | Freq: Four times a day (QID) | ORAL | 0 refills | Status: AC | PRN

## 2020-09-02 SURGICAL SUPPLY — 12 items
BAG URO CATCHER STRL LF (MISCELLANEOUS) ×2 IMPLANT
CATH URET 5FR 28IN OPEN ENDED (CATHETERS) ×2 IMPLANT
CLOTH BEACON ORANGE TIMEOUT ST (SAFETY) ×2 IMPLANT
GLOVE SURG ENC TEXT LTX SZ7.5 (GLOVE) ×2 IMPLANT
GOWN STRL REUS W/TWL XL LVL3 (GOWN DISPOSABLE) ×2 IMPLANT
GUIDEWIRE STR DUAL SENSOR (WIRE) ×1 IMPLANT
GUIDEWIRE ZIPWRE .038 STRAIGHT (WIRE) ×1 IMPLANT
KIT TURNOVER KIT A (KITS) ×2 IMPLANT
MANIFOLD NEPTUNE II (INSTRUMENTS) ×2 IMPLANT
PACK CYSTO (CUSTOM PROCEDURE TRAY) ×2 IMPLANT
STENT URET 6FRX24 CONTOUR (STENTS) ×1 IMPLANT
TUBING CONNECTING 10 (TUBING) ×2 IMPLANT

## 2020-09-02 NOTE — Progress Notes (Addendum)
Patient still nauseous and vomiting after receiving zofran and taking a shower. Paged T. Opyd. New order for 1 time dose 5mg  IV compazine ordered and given. Will continue to monitor.

## 2020-09-02 NOTE — Anesthesia Preprocedure Evaluation (Addendum)
Anesthesia Evaluation  Patient identified by MRN, date of birth, ID band Patient awake    Reviewed: Allergy & Precautions, NPO status , Patient's Chart, lab work & pertinent test results  History of Anesthesia Complications Negative for: history of anesthetic complications  Airway Mallampati: I  TM Distance: >3 FB Neck ROM: Full    Dental no notable dental hx. (+) Edentulous Upper, Poor Dentition, Dental Advisory Given   Pulmonary    Pulmonary exam normal breath sounds clear to auscultation       Cardiovascular negative cardio ROS Normal cardiovascular exam Rhythm:Regular Rate:Normal     Neuro/Psych    GI/Hepatic GERD  Medicated,  Endo/Other    Renal/GU Renal disease (R ureteral stone, Cr 1.48)     Musculoskeletal  (+) Arthritis , Osteoarthritis,    Abdominal   Peds  Hematology  (+) anemia ,   Anesthesia Other Findings   Reproductive/Obstetrics                          Anesthesia Physical Anesthesia Plan  ASA: II  Anesthesia Plan: General   Post-op Pain Management:    Induction: Intravenous  PONV Risk Score and Plan: 2 and Ondansetron, Dexamethasone, Midazolam and Treatment may vary due to age or medical condition  Airway Management Planned: LMA  Additional Equipment: None  Intra-op Plan:   Post-operative Plan: Extubation in OR  Informed Consent: I have reviewed the patients History and Physical, chart, labs and discussed the procedure including the risks, benefits and alternatives for the proposed anesthesia with the patient or authorized representative who has indicated his/her understanding and acceptance.     Dental advisory given  Plan Discussed with:   Anesthesia Plan Comments:         Anesthesia Quick Evaluation

## 2020-09-02 NOTE — Transfer of Care (Signed)
Immediate Anesthesia Transfer of Care Note  Patient: Eric Farrell  Procedure(s) Performed: CYSTOSCOPY WITH RETROGRADE PYELOGRAM/URETERAL STENT PLACEMENT (Right Bladder)  Patient Location: PACU  Anesthesia Type:General  Level of Consciousness: awake, alert , oriented, drowsy and patient cooperative  Airway & Oxygen Therapy: Patient Spontanous Breathing and Patient connected to face mask oxygen  Post-op Assessment: Report given to RN and Post -op Vital signs reviewed and stable  Post vital signs: Reviewed and stable  Last Vitals:  Vitals Value Taken Time  BP 107/66 09/02/20 1442  Temp 37.1 C 09/02/20 1443  Pulse 46 09/02/20 1444  Resp 26 09/02/20 1444  SpO2 100 % 09/02/20 1444  Vitals shown include unvalidated device data.  Last Pain:  Vitals:   09/02/20 1351  TempSrc: Oral  PainSc:          Complications: No complications documented.

## 2020-09-02 NOTE — Progress Notes (Signed)
Day of Surgery Subjective: Intermittent episodes of right flank pain overnight.  He denies nausea or vomiting.  Voiding w/o difficulty.  Creatinine improving.   Objective: Vital signs in last 24 hours: Temp:  [98.5 F (36.9 C)-99.3 F (37.4 C)] 98.5 F (36.9 C) (04/19 1351) Pulse Rate:  [42-63] 43 (04/19 1351) Resp:  [14-25] 18 (04/19 1351) BP: (110-158)/(74-89) 150/89 (04/19 1351) SpO2:  [98 %-100 %] 100 % (04/19 1351)  Intake/Output from previous day: 04/18 0701 - 04/19 0700 In: 1020.5 [I.V.:1020.5] Out: 300 [Urine:300]  Intake/Output this shift: Total I/O In: 834.4 [I.V.:834.4] Out: 225 [Urine:225]  Physical Exam:  General: Alert and oriented CV: RRR, palpable distal pulses Lungs: CTAB, equal chest rise Abdomen: Soft, NTND, no rebound or guarding Ext: NT, No erythema  Lab Results: Recent Labs    09/01/20 1334 09/02/20 0529  HGB 13.7 11.0*  HCT 41.1 33.6*   BMET Recent Labs    09/01/20 1334 09/02/20 0529  NA 145 145  K 3.6 3.9  CL 109 110  CO2 25 28  GLUCOSE 127* 118*  BUN 29* 23*  CREATININE 2.00* 1.48*  CALCIUM 9.9 9.1     Studies/Results: CT Renal Stone Study  Result Date: 09/01/2020 CLINICAL DATA:  Vomiting with right flank pain for several days. Kidney stone suspected. EXAM: CT ABDOMEN AND PELVIS WITHOUT CONTRAST TECHNIQUE: Multidetector CT imaging of the abdomen and pelvis was performed following the standard protocol without IV contrast. COMPARISON:  Abdominopelvic CT 08/15/2020. FINDINGS: Lower chest: Clear lung bases. No significant pleural or pericardial effusion. Hepatobiliary: The liver is normal in density without suspicious focal abnormality. There are grossly stable cysts within the left hepatic lobe. No evidence of gallstones, gallbladder wall thickening or biliary dilatation. Pancreas: Unremarkable. No pancreatic ductal dilatation or surrounding inflammatory changes. Spleen: Normal in size without focal abnormality. Adrenals/Urinary Tract:  Both adrenal glands appear normal. The previously demonstrated 7 mm right renal calculus has passed into the proximal right ureter and is visible on the scout image at the level of the right L4 transverse process. This is causing proximal ureteral obstruction, manifesting as proximal collecting system dilatation and mild periureteral soft tissue stranding. No other urinary tract calculi are seen. There is a stable cyst in the upper pole of the left kidney. The bladder appears unremarkable. Stomach/Bowel: No enteric contrast administered. The stomach appears unremarkable for its degree of distension. No evidence of bowel wall thickening, distention or surrounding inflammatory change. The appendix appears normal. Vascular/Lymphatic: There are no enlarged abdominal or pelvic lymph nodes. Aortic and branch vessel atherosclerosis with tortuosity. No acute vascular findings. Reproductive: The prostate gland appears unremarkable. Other: No evidence of abdominal wall mass or hernia. No ascites. Musculoskeletal: No acute or significant osseous findings. Multilevel spondylosis. IMPRESSION: 1. The previously demonstrated right renal calculus has passed into the proximal ureter and is causing collecting system obstruction. This measures 7 mm and is located at the level of the right L4 transverse process. 2. No other acute or significant findings. No additional urinary tract calculi. 3.  Aortic Atherosclerosis (ICD10-I70.0). Electronically Signed   By: Richardean Sale M.D.   On: 09/01/2020 14:42    Assessment/Plan: 7 mm right UPJ calculus stated with hydronephrosis and acute kidney injury  -The risks, benefits and alternatives of cystoscopy with RIGHT JJ stent placement was discussed with the patient.  Risks include, but are not limited to: bleeding, urinary tract infection, ureteral injury, ureteral stricture disease, chronic pain, urinary symptoms, bladder injury, stent migration, the need for nephrostomy  tube placement,  MI, CVA, DVT, PE and the inherent risks with general anesthesia.  The patient voices understanding and wishes to proceed.   -Will likely have him scheduled for ESWL as an OP in the coming weeks to address his right ureteral stone burden.   LOS: 0 days   Ellison Hughs, MD [facility] Pager: [phone]  09/02/2020, 2:15 PM

## 2020-09-02 NOTE — Progress Notes (Signed)
PROGRESS NOTE  Eric Farrell YCX:[id number] DOB: 10-Jul-1960   PCP: [facility]  Patient is from: Home  DOA: 09/01/2020 LOS: 0  Chief complaints: Right back pain  Brief Narrative / Interim history: 60 year old M with PMH of kidney stone, GERD, osteoarthritis and chronic chest pain presenting with right back and flank pain, and found to have obstructing right UPJ stone with mild hydronephrosis and AKI.  Urology planning stent placement.   Subjective: Seen and examined earlier this morning.  No major events overnight or this morning other than some nausea and small emesis.  Pain fairly controlled.  Emesis is nonbloody.  He is aware of the plan by urology today.  Objective: Vitals:   09/01/20 1803 09/01/20 2129 09/02/20 0207 09/02/20 0603  BP: 117/83 114/77 110/74 113/79  Pulse: (!) 51 (!) 53 (!) 42 (!) 46  Resp: 14 18 18 18   Temp: 99.3 F (37.4 C) 98.7 F (37.1 C) 98.8 F (37.1 C) 98.8 F (37.1 C)  TempSrc: Oral Oral    SpO2: 100% 98% 98% 98%  Weight:      Height:        Intake/Output Summary (Last 24 hours) at 09/02/2020 1050 Last data filed at 09/02/2020 0950 Gross per 24 hour  Intake 1854.8 ml  Output 525 ml  Net 1329.8 ml   Filed Weights   09/01/20 1220  Weight: 77.1 kg    Examination:  GENERAL: No apparent distress.  Nontoxic. HEENT: MMM.  Vision and hearing grossly intact.  NECK: Supple.  No apparent JVD.  RESP: On RA.  No IWOB.  Fair aeration bilaterally. CVS: HR in mid 56s but not symptomatic. Heart sounds normal.  ABD/GI/GU: BS+. Abd soft, NTND.  MSK/EXT:  Moves extremities. No apparent deformity. No edema.  SKIN: no apparent skin lesion or wound NEURO: Awake, alert and oriented appropriately.  No apparent focal neuro deficit. PSYCH: Calm. Normal affect.  Procedures:  None  Microbiology summarized: [id number] and influenza PCR nonreactive. Urine culture pending.  Assessment & Plan: Renal colic due to obstructing right UPJ stone-no signs of  infection clinically and on urinalysis. -Plan for right JJ stent by urology today -Continue IV fluid, analgesics and antiemetics -SCD for VTE prophylaxis -Remains n.p.o. pending procedure  Intractable nausea and vomiting-improved.   Reports 2 episode of emesis last night.  Abdominal exam benign.  LFT within normal. -IV fluid and antiemetics as above  AKI/azotemia: Likely due to #1.  Improving. Recent Labs    10/29/19 0203 08/15/20 1420 08/15/20 1851 09/01/20 1334 09/02/20 0529  BUN 20 44* 34* 29* 23*  CREATININE 1.39* 1.84* 1.51* 2.00* 1.48*  -IV fluid as above -Avoid nephrotoxic meds  Normocytic anemia: Drop in Hgb likely transition from hemoconcentration to hemodilution Recent Labs    10/29/19 0203 08/15/20 1420 09/01/20 1334 09/02/20 0529  HGB 14.9 15.4 13.7 11.0*  -Check anemia panel -Continue monitoring  GERD -Continue home PPI     Body mass index is 24.39 kg/m.         DVT prophylaxis:  SCDs Start: 09/01/20 1802  Code Status: SCD Family Communication: Patient prefers to to do this himself Level of care: Med-Surg Status is: Observation  The patient will require care spanning > 2 midnights and should be moved to inpatient because: Ongoing active pain requiring inpatient pain management, Ongoing diagnostic testing needed not appropriate for outpatient work up, IV treatments appropriate due to intensity of illness or inability to take PO and Inpatient level of care appropriate due to severity of  illness  Dispo: The patient is from: Home              Anticipated d/c is to: Home              Patient currently is not medically stable to d/c.   Difficult to place patient No       Consultants:  Urology   Sch Meds:  Scheduled Meds: . docusate sodium  100 mg Oral BID  . pantoprazole  40 mg Oral Daily   Continuous Infusions: . lactated ringers 125 mL/hr at 09/02/20 0946   PRN Meds:.HYDROmorphone (DILAUDID) injection, ondansetron (ZOFRAN) IV,  polyethylene glycol  Antimicrobials: Anti-infectives (From admission, onward)   None       I have personally reviewed the following labs and images: CBC: Recent Labs  Lab 09/01/20 1334 09/02/20 0529  WBC 10.5 10.5  NEUTROABS 9.2*  --   HGB 13.7 11.0*  HCT 41.1 33.6*  MCV 89.2 90.1  PLT 399 297   BMP &GFR Recent Labs  Lab 09/01/20 1334 09/02/20 0529  NA 145 145  K 3.6 3.9  CL 109 110  CO2 25 28  GLUCOSE 127* 118*  BUN 29* 23*  CREATININE 2.00* 1.48*  CALCIUM 9.9 9.1   Estimated Creatinine Clearance: 56.9 mL/min (A) (by C-G formula based on SCr of 1.48 mg/dL (H)). Liver & Pancreas: Recent Labs  Lab 09/01/20 1334 09/02/20 0529  AST 20 18  ALT 18 16  ALKPHOS 69 55  BILITOT 1.1 1.2  PROT 8.7* 6.6  ALBUMIN 4.6 3.4*   Recent Labs  Lab 09/01/20 1334  LIPASE 30   No results for input(s): AMMONIA in the last 168 hours. Diabetic: No results for input(s): HGBA1C in the last 72 hours. No results for input(s): GLUCAP in the last 168 hours. Cardiac Enzymes: No results for input(s): CKTOTAL, CKMB, CKMBINDEX, TROPONINI in the last 168 hours. No results for input(s): PROBNP in the last 8760 hours. Coagulation Profile: No results for input(s): INR, PROTIME in the last 168 hours. Thyroid Function Tests: No results for input(s): TSH, T4TOTAL, FREET4, T3FREE, THYROIDAB in the last 72 hours. Lipid Profile: No results for input(s): CHOL, HDL, LDLCALC, TRIG, CHOLHDL, LDLDIRECT in the last 72 hours. Anemia Panel: No results for input(s): VITAMINB12, FOLATE, FERRITIN, TIBC, IRON, RETICCTPCT in the last 72 hours. Urine analysis:    Component Value Date/Time   COLORURINE YELLOW 09/01/2020 1727   APPEARANCEUR CLEAR 09/01/2020 1727   LABSPEC 1.024 09/01/2020 1727   PHURINE 5.0 09/01/2020 1727   GLUCOSEU NEGATIVE 09/01/2020 1727   HGBUR MODERATE (A) 09/01/2020 1727   BILIRUBINUR NEGATIVE 09/01/2020 1727   KETONESUR 5 (A) 09/01/2020 1727   PROTEINUR 100 (A) 09/01/2020  1727   NITRITE NEGATIVE 09/01/2020 1727   LEUKOCYTESUR NEGATIVE 09/01/2020 1727   Sepsis Labs: Invalid input(s): PROCALCITONIN, [place]  Microbiology: Recent Results (from the past 240 hour(s))  Resp Panel by RT-PCR (Flu A&B, Covid) Nasopharyngeal Swab     Status: None   Collection Time: 09/01/20  3:20 PM   Specimen: Nasopharyngeal Swab; Nasopharyngeal(NP) swabs in vial transport medium  Result Value Ref Range Status   SARS Coronavirus 2 by RT PCR NEGATIVE NEGATIVE Final    Comment: (NOTE) SARS-CoV-2 target nucleic acids are NOT DETECTED.  The SARS-CoV-2 RNA is generally detectable in upper respiratory specimens during the acute phase of infection. The lowest concentration of SARS-CoV-2 viral copies this assay can detect is 138 copies/mL. A negative result does not preclude SARS-Cov-2 infection and should not  be used as the sole basis for treatment or other patient management decisions. A negative result may occur with  improper specimen collection/handling, submission of specimen other than nasopharyngeal swab, presence of viral mutation(s) within the areas targeted by this assay, and inadequate number of viral copies(<138 copies/mL). A negative result must be combined with clinical observations, patient history, and epidemiological information. The expected result is Negative.  Fact Sheet for Patients:  EntrepreneurPulse.com.au  Fact Sheet for Healthcare Providers:  IncredibleEmployment.be  This test is no t yet approved or cleared by the [place] FDA and  has been authorized for detection and/or diagnosis of SARS-CoV-2 by FDA under an Emergency Use Authorization (EUA). This EUA will remain  in effect (meaning this test can be used) for the duration of the COVID-19 declaration under Section 564(b)(1) of the Act, 21 U.S.C.section 360bbb-3(b)(1), unless the authorization is terminated  or revoked sooner.       Influenza A by  PCR NEGATIVE NEGATIVE Final   Influenza B by PCR NEGATIVE NEGATIVE Final    Comment: (NOTE) The Xpert Xpress SARS-CoV-2/FLU/RSV plus assay is intended as an aid in the diagnosis of influenza from Nasopharyngeal swab specimens and should not be used as a sole basis for treatment. Nasal washings and aspirates are unacceptable for Xpert Xpress SARS-CoV-2/FLU/RSV testing.  Fact Sheet for Patients: EntrepreneurPulse.com.au  Fact Sheet for Healthcare Providers: IncredibleEmployment.be  This test is not yet approved or cleared by the [place] FDA and has been authorized for detection and/or diagnosis of SARS-CoV-2 by FDA under an Emergency Use Authorization (EUA). This EUA will remain in effect (meaning this test can be used) for the duration of the COVID-19 declaration under Section 564(b)(1) of the Act, 21 U.S.C. section 360bbb-3(b)(1), unless the authorization is terminated or revoked.  Performed at [facility], [place] [address], [place], [place] [address]   Surgical pcr screen     Status: None   Collection Time: 09/01/20  9:01 PM   Specimen: Nasal Mucosa; Nasal Swab  Result Value Ref Range Status   MRSA, PCR NEGATIVE NEGATIVE Final   Staphylococcus aureus NEGATIVE NEGATIVE Final    Comment: (NOTE) The Xpert SA Assay (FDA approved for NASAL specimens in patients 52 years of age and older), is one component of a comprehensive surveillance program. It is not intended to diagnose infection nor to guide or monitor treatment. Performed at [facility], [place] [address], [place], [place] [address]     Radiology Studies: CT Renal Stone Study  Result Date: 09/01/2020 CLINICAL DATA:  Vomiting with right flank pain for several days. Kidney stone suspected. EXAM: CT ABDOMEN AND PELVIS WITHOUT CONTRAST TECHNIQUE: Multidetector CT imaging of the abdomen and pelvis was performed following the standard protocol  without IV contrast. COMPARISON:  Abdominopelvic CT 08/15/2020. FINDINGS: Lower chest: Clear lung bases. No significant pleural or pericardial effusion. Hepatobiliary: The liver is normal in density without suspicious focal abnormality. There are grossly stable cysts within the left hepatic lobe. No evidence of gallstones, gallbladder wall thickening or biliary dilatation. Pancreas: Unremarkable. No pancreatic ductal dilatation or surrounding inflammatory changes. Spleen: Normal in size without focal abnormality. Adrenals/Urinary Tract: Both adrenal glands appear normal. The previously demonstrated 7 mm right renal calculus has passed into the proximal right ureter and is visible on the scout image at the level of the right L4 transverse process. This is causing proximal ureteral obstruction, manifesting as proximal collecting system dilatation and mild periureteral soft tissue stranding. No other urinary tract  calculi are seen. There is a stable cyst in the upper pole of the left kidney. The bladder appears unremarkable. Stomach/Bowel: No enteric contrast administered. The stomach appears unremarkable for its degree of distension. No evidence of bowel wall thickening, distention or surrounding inflammatory change. The appendix appears normal. Vascular/Lymphatic: There are no enlarged abdominal or pelvic lymph nodes. Aortic and branch vessel atherosclerosis with tortuosity. No acute vascular findings. Reproductive: The prostate gland appears unremarkable. Other: No evidence of abdominal wall mass or hernia. No ascites. Musculoskeletal: No acute or significant osseous findings. Multilevel spondylosis. IMPRESSION: 1. The previously demonstrated right renal calculus has passed into the proximal ureter and is causing collecting system obstruction. This measures 7 mm and is located at the level of the right L4 transverse process. 2. No other acute or significant findings. No additional urinary tract calculi. 3.  Aortic  Atherosclerosis (ICD10-I70.0). Electronically Signed   By: Richardean Sale M.D.   On: 09/01/2020 14:42     Tani Virgo T. [place]  If 7PM-7AM, please contact night-coverage [email or web address] 09/02/2020, 10:50 AM

## 2020-09-02 NOTE — Anesthesia Procedure Notes (Signed)
Procedure Name: Intubation Date/Time: 09/02/2020 2:18 PM Performed by: Raenette Rover, CRNA Pre-anesthesia Checklist: Patient identified, Emergency Drugs available, Suction available and Patient being monitored Patient Re-evaluated:Patient Re-evaluated prior to induction Oxygen Delivery Method: Circle system utilized Preoxygenation: Pre-oxygenation with 100% oxygen Induction Type: IV induction, Rapid sequence and Cricoid Pressure applied Laryngoscope Size: Mac and 3 Grade View: Grade I Tube type: Oral Tube size: 7.0 mm Number of attempts: 1 Airway Equipment and Method: Stylet Placement Confirmation: ETT inserted through vocal cords under direct vision,  breath sounds checked- equal and bilateral and positive ETCO2 Secured at: 22 cm Tube secured with: Tape Dental Injury: Teeth and Oropharynx as per pre-operative assessment

## 2020-09-02 NOTE — Progress Notes (Signed)
Initial Nutrition Assessment  DOCUMENTATION CODES:   Not applicable  INTERVENTION:  - diet advancement as medically feasible. - complete NFPE when feasible.   NUTRITION DIAGNOSIS:   Inadequate oral intake related to inability to eat as evidenced by NPO status.  GOAL:   Patient will meet greater than or equal to 90% of their needs  MONITOR:   Diet advancement,Labs,Weight trends  REASON FOR ASSESSMENT:   Malnutrition Screening Tool  ASSESSMENT:   60 y.o. male with medical history of kidney stone, GERD, osteoarthritis, scoliosis, and chronic chest pain. He presented to the ED due to severe, intermittent R back pain x2 days, dark urine, and N/V. He was seen in the ED on 4/1 due to R flank pain and CT at that time showed non-obstructing nephrolithiasis.  Diet changed from Regular to NPO today at 0300. No intake from dinner yesterday documented.   Patient is out of the room to OR; in OR since ~1345.   He has not been seen by a [place] RD at any time in the past.   Weight yesterday was documented as 170 lb, which appears to be a stated weight. PTA, the most recently documented weight was 156 lb on 01/03/20.  Per notes: - renal colic d/t obstructing R UPJ stone with plan for R stent by Urology - intractable N/V--improving - AKI--improving - normocytic anemia   Labs reviewed; BUN: 23 mg/dl, creatinine: 1.48 mg/dl, GFR: 55 ml/min. Medications reviewed; 100 mg colace BID, 40 mg oral protonix/day. IVF; LR @ 125 ml/hr.     NUTRITION - FOCUSED PHYSICAL EXAM:  inability to complete at this time.   Diet Order:   Diet Order            Diet NPO time specified  Diet effective now                 EDUCATION NEEDS:   Not appropriate for education at this time  Skin:  Skin Assessment: Reviewed RN Assessment  Last BM:  PTA/unknown  Height:   Ht Readings from Last 1 Encounters:  09/01/20 5\' 10"  (1.778 m)    Weight:   Wt Readings from Last 1 Encounters:   09/01/20 77.1 kg    Estimated Nutritional Needs:  Kcal:  1900-2100 kcal Protein:  95-105 grams Fluid:  >/= 2.2 L/day      Jarome Matin, MS, RD, LDN, CNSC Inpatient Clinical Dietitian RD pager # available in [place]  After hours/weekend pager # available in [facility]

## 2020-09-02 NOTE — Op Note (Signed)
Operative Note  Preoperative diagnosis:  1.  7 mm right proximal ureteral calculus  Postoperative diagnosis: 1.  Same  Procedure(s): 1.  Cystoscopy with right JJ stent placement 2.  Right retrograde pyelogram with intraoperative interpretation of fluoroscopic imaging  Surgeon: Ellison Hughs, MD  Assistants:  None  Anesthesia:  General  Complications:  None  EBL: Less than 5 mL  Specimens: 1.  None  Drains/Catheters: 1.  Right 6 French, 24 cm JJ stent without tether  Intraoperative findings:   1. Right retrograde pyelogram revealed a filling defect within the proximal aspects of the right ureter, consistent with the obstructing stone seen on CT from 09/01/2020.  There was proximal dilation of the ureter and renal pelvis, but no other filling defects were identified.  Indication:  Eric Farrell is a 60 y.o. male with an obstructing 7 mm right proximal ureteral calculus associated with moderate hydronephrosis, renal colic and acute kidney injury.  He has been consented for the above procedures, voices understanding and wished to proceed.  Description of procedure:  After informed consent was obtained, the patient was brought to the operating room and general LMA anesthesia was administered. The patient was then placed in the dorsolithotomy position and prepped and draped in the usual sterile fashion. A timeout was performed. A 23 French rigid cystoscope was then inserted into the urethral meatus and advanced into the bladder under direct vision. A complete bladder survey revealed no intravesical pathology.  A 5 French ureteral catheter was then inserted into the right ureteral orifice and a retrograde pyelogram was obtained, with the findings listed above.  A Glidewire was then used to intubate the lumen of the ureteral catheter and was advanced up to the right renal pelvis, under fluoroscopic guidance.  The catheter was then removed, leaving the wire in place.  A 6 French, 24 cm  JJ stent was then advanced over the wire and into good position within the right collecting system, confirming placement via fluoroscopy.  The patient's bladder was drained.  He tolerated the procedure well and was transferred to the postanesthesia in stable condition.  Plan: I will arrange outpatient ESWL for definitive stone treatment.

## 2020-09-03 ENCOUNTER — Encounter (HOSPITAL_COMMUNITY): Payer: Self-pay | Admitting: Urology

## 2020-09-03 DIAGNOSIS — N201 Calculus of ureter: Secondary | ICD-10-CM

## 2020-09-03 DIAGNOSIS — N200 Calculus of kidney: Secondary | ICD-10-CM | POA: Diagnosis not present

## 2020-09-03 LAB — MAGNESIUM: Magnesium: 1.6 mg/dL — ABNORMAL LOW (ref 1.7–2.4)

## 2020-09-03 LAB — RENAL FUNCTION PANEL
Albumin: 3.3 g/dL — ABNORMAL LOW (ref 3.5–5.0)
Anion gap: 8 (ref 5–15)
BUN: 20 mg/dL (ref 6–20)
CO2: 27 mmol/L (ref 22–32)
Calcium: 8.6 mg/dL — ABNORMAL LOW (ref 8.9–10.3)
Chloride: 105 mmol/L (ref 98–111)
Creatinine, Ser: 1.25 mg/dL — ABNORMAL HIGH (ref 0.61–1.24)
GFR, Estimated: >60 mL/min (ref >60)
Glucose, Bld: 103 mg/dL — ABNORMAL HIGH (ref 70–99)
Phosphorus: 2.9 mg/dL (ref 2.5–4.6)
Potassium: 3.4 mmol/L — ABNORMAL LOW (ref 3.5–5.1)
Sodium: 140 mmol/L (ref 135–145)

## 2020-09-03 LAB — CBC
HCT: 33.7 % — ABNORMAL LOW (ref 39.0–52.0)
Hemoglobin: 11.3 g/dL — ABNORMAL LOW (ref 13.0–17.0)
MCH: 29.8 pg (ref 26.0–34.0)
MCHC: 33.5 g/dL (ref 30.0–36.0)
MCV: 88.9 fL (ref 80.0–100.0)
Platelets: 283 10*3/uL (ref 150–400)
RBC: 3.79 MIL/uL — ABNORMAL LOW (ref 4.22–5.81)
RDW: 14.6 % (ref 11.5–15.5)
WBC: 8.8 10*3/uL (ref 4.0–10.5)
nRBC: 0 % (ref 0.0–0.2)

## 2020-09-03 LAB — IRON AND TIBC
Iron: 78 ug/dL (ref 45–182)
Saturation Ratios: 34 % (ref 17.9–39.5)
TIBC: 230 ug/dL — ABNORMAL LOW (ref 250–450)
UIBC: 152 ug/dL

## 2020-09-03 LAB — URINE CULTURE: Culture: NO GROWTH

## 2020-09-03 LAB — VITAMIN B12: Vitamin B-12: 838 pg/mL (ref 180–914)

## 2020-09-03 LAB — RETICULOCYTES
Immature Retic Fract: 6.2 % (ref 2.3–15.9)
RBC.: 3.76 MIL/uL — ABNORMAL LOW (ref 4.22–5.81)
Retic Count, Absolute: 43.6 10*3/uL (ref 19.0–186.0)
Retic Ct Pct: 1.2 % (ref 0.4–3.1)

## 2020-09-03 LAB — FERRITIN: Ferritin: 123 ng/mL (ref 24–336)

## 2020-09-03 LAB — FOLATE: Folate: 14.3 ng/mL (ref >5.9)

## 2020-09-03 MED ORDER — MAGNESIUM SULFATE 2 GM/50ML IV SOLN
2.0000 g | Freq: Once | INTRAVENOUS | Status: AC
  Administered 2020-09-03: 2 g via INTRAVENOUS
  Filled 2020-09-03: qty 50

## 2020-09-03 MED ORDER — ONDANSETRON HCL 4 MG PO TABS: 4.0000 mg | ORAL_TABLET | Freq: Every day | ORAL | 1 refills | Status: AC | PRN

## 2020-09-03 MED ORDER — POTASSIUM CHLORIDE 20 MEQ PO PACK
40.0000 meq | PACK | Freq: Once | ORAL | Status: AC
  Administered 2020-09-03: 40 meq via ORAL
  Filled 2020-09-03: qty 2

## 2020-09-03 NOTE — Progress Notes (Signed)
1 Day Post-Op Subjective: No acute events overnight.  Minimal right flank pain this morning.  He states that he had a small amount of gross hematuria immediately following surgery that has since resolved.  He denies nausea/vomiting or dysuria.  Doing well.  Objective: Vital signs in last 24 hours: Temp:  [97.5 F (36.4 C)-98.8 F (37.1 C)] 98.7 F (37.1 C) (04/20 0549) Pulse Rate:  [40-51] 40 (04/20 0549) Resp:  [14-28] 16 (04/20 0549) BP: (104-150)/(65-92) 144/92 (04/20 0549) SpO2:  [95 %-100 %] 100 % (04/20 0549)  Intake/Output from previous day: 04/19 0701 - 04/20 0700 In: 1084.4 [I.V.:1034.4; IV Piggyback:50] Out: 900 [Urine:900]  Intake/Output this shift: Total I/O In: 120 [P.O.:120] Out: 200 [Urine:200]  Physical Exam:  General: Alert and oriented CV: RRR, palpable distal pulses Lungs: CTAB, equal chest rise Abdomen: Soft, NTND, no rebound or guarding Ext: NT, No erythema  Lab Results: Recent Labs    09/01/20 1334 09/02/20 0529 09/03/20 0450  HGB 13.7 11.0* 11.3*  HCT 41.1 33.6* 33.7*   BMET Recent Labs    09/02/20 0529 09/03/20 0450  NA 145 140  K 3.9 3.4*  CL 110 105  CO2 28 27  GLUCOSE 118* 103*  BUN 23* 20  CREATININE 1.48* 1.25*  CALCIUM 9.1 8.6*     Studies/Results: DG C-Arm 1-60 Min-No Report  Result Date: 09/02/2020 Fluoroscopy was utilized by the requesting physician.  No radiographic interpretation.   CT Renal Stone Study  Result Date: 09/01/2020 CLINICAL DATA:  Vomiting with right flank pain for several days. Kidney stone suspected. EXAM: CT ABDOMEN AND PELVIS WITHOUT CONTRAST TECHNIQUE: Multidetector CT imaging of the abdomen and pelvis was performed following the standard protocol without IV contrast. COMPARISON:  Abdominopelvic CT 08/15/2020. FINDINGS: Lower chest: Clear lung bases. No significant pleural or pericardial effusion. Hepatobiliary: The liver is normal in density without suspicious focal abnormality. There are grossly  stable cysts within the left hepatic lobe. No evidence of gallstones, gallbladder wall thickening or biliary dilatation. Pancreas: Unremarkable. No pancreatic ductal dilatation or surrounding inflammatory changes. Spleen: Normal in size without focal abnormality. Adrenals/Urinary Tract: Both adrenal glands appear normal. The previously demonstrated 7 mm right renal calculus has passed into the proximal right ureter and is visible on the scout image at the level of the right L4 transverse process. This is causing proximal ureteral obstruction, manifesting as proximal collecting system dilatation and mild periureteral soft tissue stranding. No other urinary tract calculi are seen. There is a stable cyst in the upper pole of the left kidney. The bladder appears unremarkable. Stomach/Bowel: No enteric contrast administered. The stomach appears unremarkable for its degree of distension. No evidence of bowel wall thickening, distention or surrounding inflammatory change. The appendix appears normal. Vascular/Lymphatic: There are no enlarged abdominal or pelvic lymph nodes. Aortic and branch vessel atherosclerosis with tortuosity. No acute vascular findings. Reproductive: The prostate gland appears unremarkable. Other: No evidence of abdominal wall mass or hernia. No ascites. Musculoskeletal: No acute or significant osseous findings. Multilevel spondylosis. IMPRESSION: 1. The previously demonstrated right renal calculus has passed into the proximal ureter and is causing collecting system obstruction. This measures 7 mm and is located at the level of the right L4 transverse process. 2. No other acute or significant findings. No additional urinary tract calculi. 3.  Aortic Atherosclerosis (ICD10-I70.0). Electronically Signed   By: Richardean Sale M.D.   On: 09/01/2020 14:42    Assessment/Plan: 7 mm obstructing right ureteral calculus  -The risks, benefits and alternatives of  RIGHT ESWL was discussed with the patient. I  described the risks which include arrhythmia, kidney contusion, kidney hemorrhage, need for transfusion, back discomfort, flank ecchymosis, flank abrasion, inability to break up stone, inability to pass stone fragments, Steinstrasse, infection associated with obstructing stones, need for different surgical procedure and possible need for repeat shockwave lithotripsy.  The patient voices understanding and wishes to proceed.    -Will arrange ESWL as an OP -ok to discharge from GU standpoint   LOS: 1 day   Ellison Hughs, MD [facility] Pager: [phone]  09/03/2020, 10:35 AM

## 2020-09-03 NOTE — Discharge Instructions (Signed)
Advised to follow-up with primary care physician in 1 week. Advised to follow-up with urology in 2 weeks to schedule ESWL. Will get primary care physician recheck CBC and CMP in 1 week.

## 2020-09-03 NOTE — Discharge Summary (Addendum)
Physician Discharge Summary  Eric Farrell FIE:[id number] DOB: [birth year] DOA: 09/01/2020  PCP: [facility]  Admit date: 09/01/2020   Discharge date: 09/03/2020  Admitted From: Home.  Disposition:  Home.  Recommendations for Outpatient Follow-up:  1. Follow up with PCP in 1-2 weeks. 2. Please obtain BMP/CBC in one week. 3. Follow-up with urology in 2 weeks for ESWL for ureteral stone. 4. Patient reports having chronic bradycardia.  Patient remains asymptomatic.  Home Health: None. Equipment/Devices: None  Discharge Condition: Stable CODE STATUS:Full code Diet recommendation: Heart Healthy  Brief [facility] Course: This 60 year old Male with PMH of kidney stones, GERD, osteoarthritis and chronic musculoskeletal chest pain presented in the ED with right back and flank pain, and found to have obstructing right UPJ stone with mild hydronephrosis and AKI.   Patient was admitted for hydronephrosis and AKI secondary to UPJ stone.  Urology was consulted.  Patient was started on IV fluids.  Patient underwent right JJ stent on 4/19.  Tolerated well.  Patient had gross hematuria following stenting which has resolved.  Patient feels better, renal functions are improving.  Patient will need definite treatment for ureteral stone.  Urology has cleared the patient for discharge.  Urology has recommended extracorporeal shockwave lithotripsy for the stone in 2 weeks.  Other labs unremarkable.  There is no leukocytosis, urine culture no growth.  Patient heart rate remains in 40s but patient was asymptomatic.  EKG showed sinus bradycardia.  Patient reports his heart rate always remains in 40s and 50s,  He denies any symptoms.  Patient feels better and patient is being discharged home.  He was managed for below problems.  Discharge Diagnoses:  Active Problems:   Nephrolithiasis   Obstruction of right ureteropelvic junction (UPJ) due to stone  Renal colic due to obstructing rightUPJ  stone: -No signs of infection clinically and on urinalysis. -Patient underwent JJ stent on 4/19. -Continue IV fluid, analgesics and antiemetics -SCD for VTEprophylaxis -Urology recommended ESWL outpatient in 2 weeks. -Patient cleared from urology to be discharged.  Intractable nausea and vomiting-improved.   Reports 2 episode of emesis .  Abdominal exam benign. LFT within normal. -IV fluid and antiemetics as above  AKI/azotemia: Likely due to #1.  Improving. -Avoid nephrotoxic meds  Normocytic anemia:  Drop in Hgb likely transition from hemoconcentration to hemodilution  GERD -Continue home PPI   Discharge Instructions  Discharge Instructions    Call MD for:  difficulty breathing, headache or visual disturbances   Complete by: As directed    Call MD for:  persistant nausea and vomiting   Complete by: As directed    Call MD for:  severe uncontrolled pain   Complete by: As directed    Call MD for:  temperature >100.4   Complete by: As directed    Diet - low sodium heart healthy   Complete by: As directed    Diet Carb Modified   Complete by: As directed    Discharge instructions   Complete by: As directed    Advised to follow-up with primary care physician in 1 week. Advised to follow-up with urology in 2 weeks to schedule ESWL. Will get primary care physician recheck CBC and CMP in 1 week.   Increase activity slowly   Complete by: As directed      Allergies as of 09/03/2020   No Known Allergies     Medication List    TAKE these medications   ondansetron 4 MG tablet Commonly known as: Zofran Take 1  tablet (4 mg total) by mouth daily as needed for nausea or vomiting.   pantoprazole 40 MG tablet Commonly known as: PROTONIX Take 40 mg by mouth daily.   traMADol 50 MG tablet Commonly known as: Ultram Take 1 tablet (50 mg total) by mouth every 6 (six) hours as needed for up to 3 days.       Follow-up Information    Ceasar Mons, MD.    Specialty: Urology Why: My office will call to arrange shockwave lithotripsy for treatment of your kidney stone. Contact information: [place] [address] [phone]        [facility] Follow up in 1 week(s).   Contact information: [place] [address] [phone]              No Known Allergies  Consultations:  Urology   Procedures/Studies: DG C-Arm 1-60 Min-No Report  Result Date: 09/02/2020 Fluoroscopy was utilized by the requesting physician.  No radiographic interpretation.   CT Renal Stone Study  Result Date: 09/01/2020 CLINICAL DATA:  Vomiting with right flank pain for several days. Kidney stone suspected. EXAM: CT ABDOMEN AND PELVIS WITHOUT CONTRAST TECHNIQUE: Multidetector CT imaging of the abdomen and pelvis was performed following the standard protocol without IV contrast. COMPARISON:  Abdominopelvic CT 08/15/2020. FINDINGS: Lower chest: Clear lung bases. No significant pleural or pericardial effusion. Hepatobiliary: The liver is normal in density without suspicious focal abnormality. There are grossly stable cysts within the left hepatic lobe. No evidence of gallstones, gallbladder wall thickening or biliary dilatation. Pancreas: Unremarkable. No pancreatic ductal dilatation or surrounding inflammatory changes. Spleen: Normal in size without focal abnormality. Adrenals/Urinary Tract: Both adrenal glands appear normal. The previously demonstrated 7 mm right renal calculus has passed into the proximal right ureter and is visible on the scout image at the level of the right L4 transverse process. This is causing proximal ureteral obstruction, manifesting as proximal collecting system dilatation and mild periureteral soft tissue stranding. No other urinary tract calculi are seen. There is a stable cyst in the upper pole of the left kidney. The bladder appears unremarkable. Stomach/Bowel: No  enteric contrast administered. The stomach appears unremarkable for its degree of distension. No evidence of bowel wall thickening, distention or surrounding inflammatory change. The appendix appears normal. Vascular/Lymphatic: There are no enlarged abdominal or pelvic lymph nodes. Aortic and branch vessel atherosclerosis with tortuosity. No acute vascular findings. Reproductive: The prostate gland appears unremarkable. Other: No evidence of abdominal wall mass or hernia. No ascites. Musculoskeletal: No acute or significant osseous findings. Multilevel spondylosis. IMPRESSION: 1. The previously demonstrated right renal calculus has passed into the proximal ureter and is causing collecting system obstruction. This measures 7 mm and is located at the level of the right L4 transverse process. 2. No other acute or significant findings. No additional urinary tract calculi. 3.  Aortic Atherosclerosis (ICD10-I70.0). Electronically Signed   By: Richardean Sale M.D.   On: 09/01/2020 14:42   CT Renal Stone Study  Result Date: 08/15/2020 CLINICAL DATA:  Bilateral flank pain.  Kidney stone suspected. EXAM: CT ABDOMEN AND PELVIS WITHOUT CONTRAST TECHNIQUE: Multidetector CT imaging of the abdomen and pelvis was performed following the standard protocol without IV contrast. COMPARISON:  CT renal 05/08/2018. FINDINGS: Lower chest: No acute abnormality. Hepatobiliary: Couple left hepatic lobe fluid density lesions likely represent simple hepatic cysts. Otherwise no focal liver abnormality. No gallstones, gallbladder wall thickening, or pericholecystic fluid. No biliary dilatation. Pancreas: No  focal lesion. Normal pancreatic contour. No surrounding inflammatory changes. No main pancreatic ductal dilatation. Spleen: Normal in size without focal abnormality. Adrenals/Urinary Tract: No adrenal nodule bilaterally. There is a 6 mm calcified stone within the right kidney. No left nephrolithiasis. Question mild fullness of the right  superior renal pole calyx. There is a 2.5 cm fluid density lesion within left kidney likely represents a simple renal cyst. No contour-deforming renal mass. No ureterolithiasis or hydroureter. The urinary bladder is unremarkable. Stomach/Bowel: Stomach is within normal limits. No evidence of bowel wall thickening or dilatation. Appendix appears normal. Vascular/Lymphatic: No abdominal aorta or iliac aneurysm. Mild atherosclerotic plaque of the aorta and its branches. No abdominal, pelvic, or inguinal lymphadenopathy. Reproductive: Prostate is unremarkable. Other: No intraperitoneal free fluid. No intraperitoneal free gas. No organized fluid collection. Musculoskeletal: No abdominal wall hernia or abnormality. No suspicious lytic or blastic osseous lesions. No acute displaced fracture. Multilevel degenerative changes of the spine. IMPRESSION: 1. A 6 mm right superior pole nephrolithiasis with question of mild fullness of the associated superior renal calyx. No definite hydronephrosis. 2.  Aortic Atherosclerosis (ICD10-I70.0). Electronically Signed   By: Iven Finn M.D.   On: 08/15/2020 15:28   UPJ stent.   Subjective: Patient was seen and examined at bedside.  Overnight events noted.  Patient reports feeling much improved.  Hematuria has resolved.  He still has some discomfort while urination but feeling better. Patient has ambulated in the hallway and wants to be discharged.  Discharge Exam: Vitals:   09/02/20 2101 09/03/20 0549  BP: 134/81 (!) 144/92  Pulse: (!) 41 (!) 40  Resp: 16 16  Temp: 98.4 F (36.9 C) 98.7 F (37.1 C)  SpO2: 100% 100%   Vitals:   09/02/20 1515 09/02/20 1532 09/02/20 2101 09/03/20 0549  BP: 121/76 133/87 134/81 (!) 144/92  Pulse: (!) 44 (!) 41 (!) 41 (!) 40  Resp: 15  16 16   Temp: 97.8 F (36.6 C) (!) 97.5 F (36.4 C) 98.4 F (36.9 C) 98.7 F (37.1 C)  TempSrc:  Oral    SpO2: 100% 95% 100% 100%  Weight:      Height:        General: Pt is alert, awake,  not in acute distress Cardiovascular: RRR, S1/S2 +, no rubs, no gallops Respiratory: CTA bilaterally, no wheezing, no rhonchi Abdominal: Soft, NT, ND, bowel sounds + Extremities: no edema, no cyanosis    The results of significant diagnostics from this hospitalization (including imaging, microbiology, ancillary and laboratory) are listed below for reference.     Microbiology: Recent Results (from the past 240 hour(s))  Resp Panel by RT-PCR (Flu A&B, Covid) Nasopharyngeal Swab     Status: None   Collection Time: 09/01/20  3:20 PM   Specimen: Nasopharyngeal Swab; Nasopharyngeal(NP) swabs in vial transport medium  Result Value Ref Range Status   SARS Coronavirus 2 by RT PCR NEGATIVE NEGATIVE Final    Comment: (NOTE) SARS-CoV-2 target nucleic acids are NOT DETECTED.  The SARS-CoV-2 RNA is generally detectable in upper respiratory specimens during the acute phase of infection. The lowest concentration of SARS-CoV-2 viral copies this assay can detect is 138 copies/mL. A negative result does not preclude SARS-Cov-2 infection and should not be used as the sole basis for treatment or other patient management decisions. A negative result may occur with  improper specimen collection/handling, submission of specimen other than nasopharyngeal swab, presence of viral mutation(s) within the areas targeted by this assay, and inadequate number of viral copies(<138 copies/mL).  A negative result must be combined with clinical observations, patient history, and epidemiological information. The expected result is Negative.  Fact Sheet for Patients:  EntrepreneurPulse.com.au  Fact Sheet for Healthcare Providers:  IncredibleEmployment.be  This test is no t yet approved or cleared by the [place] FDA and  has been authorized for detection and/or diagnosis of SARS-CoV-2 by FDA under an Emergency Use Authorization (EUA). This EUA will remain  in effect  (meaning this test can be used) for the duration of the COVID-19 declaration under Section 564(b)(1) of the Act, 21 U.S.C.section 360bbb-3(b)(1), unless the authorization is terminated  or revoked sooner.       Influenza A by PCR NEGATIVE NEGATIVE Final   Influenza B by PCR NEGATIVE NEGATIVE Final    Comment: (NOTE) The Xpert Xpress SARS-CoV-2/FLU/RSV plus assay is intended as an aid in the diagnosis of influenza from Nasopharyngeal swab specimens and should not be used as a sole basis for treatment. Nasal washings and aspirates are unacceptable for Xpert Xpress SARS-CoV-2/FLU/RSV testing.  Fact Sheet for Patients: EntrepreneurPulse.com.au  Fact Sheet for Healthcare Providers: IncredibleEmployment.be  This test is not yet approved or cleared by the [place] FDA and has been authorized for detection and/or diagnosis of SARS-CoV-2 by FDA under an Emergency Use Authorization (EUA). This EUA will remain in effect (meaning this test can be used) for the duration of the COVID-19 declaration under Section 564(b)(1) of the Act, 21 U.S.C. section 360bbb-3(b)(1), unless the authorization is terminated or revoked.  Performed at [facility], [place] [address], [place], [place] [address]   Urine Culture     Status: None   Collection Time: 09/01/20  5:27 PM   Specimen: Urine, Clean Catch  Result Value Ref Range Status   Specimen Description   Final    URINE, CLEAN CATCH Performed at [facility], [place] [address], [place], [place] [address]    Special Requests   Final    NONE Performed at [facility], [place] [address] [place], [place] [address]    Culture   Final    NO GROWTH Performed at [facility], [place] [address], [place], [place] [address]    Report Status 09/03/2020 FINAL  Final  Surgical pcr screen     Status: None   Collection Time: 09/01/20  9:01 PM   Specimen: Nasal  Mucosa; Nasal Swab  Result Value Ref Range Status   MRSA, PCR NEGATIVE NEGATIVE Final   Staphylococcus aureus NEGATIVE NEGATIVE Final    Comment: (NOTE) The Xpert SA Assay (FDA approved for NASAL specimens in patients 25 years of age and older), is one component of a comprehensive surveillance program. It is not intended to diagnose infection nor to guide or monitor treatment. Performed at [facility], [place] [address] [place],  [address]      Labs: BNP (last 3 results) No results for input(s): BNP in the last 8760 hours. Basic Metabolic Panel: Recent Labs  Lab 09/01/20 1334 09/02/20 0529 09/03/20 0450  NA 145 145 140  K 3.6 3.9 3.4*  CL 109 110 105  CO2 25 28 27   GLUCOSE 127* 118* 103*  BUN 29* 23* 20  CREATININE 2.00* 1.48* 1.25*  CALCIUM 9.9 9.1 8.6*  MG  --   --  1.6*  PHOS  --   --  2.9   Liver Function Tests: Recent Labs  Lab 09/01/20 1334 09/02/20 0529 09/03/20 0450  AST 20 18  --  ALT 18 16  --   ALKPHOS 69 55  --   BILITOT 1.1 1.2  --   PROT 8.7* 6.6  --   ALBUMIN 4.6 3.4* 3.3*   Recent Labs  Lab 09/01/20 1334  LIPASE 30   No results for input(s): AMMONIA in the last 168 hours. CBC: Recent Labs  Lab 09/01/20 1334 09/02/20 0529 09/03/20 0450  WBC 10.5 10.5 8.8  NEUTROABS 9.2*  --   --   HGB 13.7 11.0* 11.3*  HCT 41.1 33.6* 33.7*  MCV 89.2 90.1 88.9  PLT 399 297 283   Cardiac Enzymes: No results for input(s): CKTOTAL, CKMB, CKMBINDEX, TROPONINI in the last 168 hours. BNP: Invalid input(s): POCBNP CBG: No results for input(s): GLUCAP in the last 168 hours. D-Dimer No results for input(s): DDIMER in the last 72 hours. Hgb A1c No results for input(s): HGBA1C in the last 72 hours. Lipid Profile No results for input(s): CHOL, HDL, LDLCALC, TRIG, CHOLHDL, LDLDIRECT in the last 72 hours. Thyroid function studies No results for input(s): TSH, T4TOTAL, T3FREE, THYROIDAB in the last 72 hours.  Invalid input(s):  FREET3 Anemia work up Recent Labs    09/03/20 0450  VITAMINB12 838  FOLATE 14.3  FERRITIN 123  TIBC 230*  IRON 78  RETICCTPCT 1.2   Urinalysis    Component Value Date/Time   COLORURINE YELLOW 09/01/2020 [place] 09/01/2020 1727   LABSPEC 1.024 09/01/2020 1727   PHURINE 5.0 09/01/2020 1727   GLUCOSEU NEGATIVE 09/01/2020 1727   HGBUR MODERATE (A) 09/01/2020 1727   BILIRUBINUR NEGATIVE 09/01/2020 1727   KETONESUR 5 (A) 09/01/2020 1727   PROTEINUR 100 (A) 09/01/2020 1727   NITRITE NEGATIVE 09/01/2020 1727   LEUKOCYTESUR NEGATIVE 09/01/2020 1727   Sepsis Labs Invalid input(s): PROCALCITONIN,  WBC,  LACTICIDVEN Microbiology Recent Results (from the past 240 hour(s))  Resp Panel by RT-PCR (Flu A&B, Covid) Nasopharyngeal Swab     Status: None   Collection Time: 09/01/20  3:20 PM   Specimen: Nasopharyngeal Swab; Nasopharyngeal(NP) swabs in vial transport medium  Result Value Ref Range Status   SARS Coronavirus 2 by RT PCR NEGATIVE NEGATIVE Final    Comment: (NOTE) SARS-CoV-2 target nucleic acids are NOT DETECTED.  The SARS-CoV-2 RNA is generally detectable in upper respiratory specimens during the acute phase of infection. The lowest concentration of SARS-CoV-2 viral copies this assay can detect is 138 copies/mL. A negative result does not preclude SARS-Cov-2 infection and should not be used as the sole basis for treatment or other patient management decisions. A negative result may occur with  improper specimen collection/handling, submission of specimen other than nasopharyngeal swab, presence of viral mutation(s) within the areas targeted by this assay, and inadequate number of viral copies(<138 copies/mL). A negative result must be combined with clinical observations, patient history, and epidemiological information. The expected result is Negative.  Fact Sheet for Patients:  EntrepreneurPulse.com.au  Fact Sheet for Healthcare  Providers:  IncredibleEmployment.be  This test is no t yet approved or cleared by the [place] FDA and  has been authorized for detection and/or diagnosis of SARS-CoV-2 by FDA under an Emergency Use Authorization (EUA). This EUA will remain  in effect (meaning this test can be used) for the duration of the COVID-19 declaration under Section 564(b)(1) of the Act, 21 U.S.C.section 360bbb-3(b)(1), unless the authorization is terminated  or revoked sooner.       Influenza A by PCR NEGATIVE NEGATIVE Final   Influenza B by PCR NEGATIVE NEGATIVE  Final    Comment: (NOTE) The Xpert Xpress SARS-CoV-2/FLU/RSV plus assay is intended as an aid in the diagnosis of influenza from Nasopharyngeal swab specimens and should not be used as a sole basis for treatment. Nasal washings and aspirates are unacceptable for Xpert Xpress SARS-CoV-2/FLU/RSV testing.  Fact Sheet for Patients: EntrepreneurPulse.com.au  Fact Sheet for Healthcare Providers: IncredibleEmployment.be  This test is not yet approved or cleared by the [place] FDA and has been authorized for detection and/or diagnosis of SARS-CoV-2 by FDA under an Emergency Use Authorization (EUA). This EUA will remain in effect (meaning this test can be used) for the duration of the COVID-19 declaration under Section 564(b)(1) of the Act, 21 U.S.C. section 360bbb-3(b)(1), unless the authorization is terminated or revoked.  Performed at [facility], [place] [address], [place], [place] [address]   Urine Culture     Status: None   Collection Time: 09/01/20  5:27 PM   Specimen: Urine, Clean Catch  Result Value Ref Range Status   Specimen Description   Final    URINE, CLEAN CATCH Performed at [facility], [place] [address], [place], [place] [address]    Special Requests   Final    NONE Performed at [facility], [place] [address], [place], [place] [address]    Culture   Final    NO GROWTH Performed at [facility], [place] [address], [place], [place] [address]    Report Status 09/03/2020 FINAL  Final  Surgical pcr screen     Status: None   Collection Time: 09/01/20  9:01 PM   Specimen: Nasal Mucosa; Nasal Swab  Result Value Ref Range Status   MRSA, PCR NEGATIVE NEGATIVE Final   Staphylococcus aureus NEGATIVE NEGATIVE Final    Comment: (NOTE) The Xpert SA Assay (FDA approved for NASAL specimens in patients 25 years of age and older), is one component of a comprehensive surveillance program. It is not intended to diagnose infection nor to guide or monitor treatment. Performed at [facility], [place] [address], [place], [place] [address]      Time coordinating discharge: Over 30 minutes  SIGNED:   Shawna Clamp, MD  [facility] 09/03/2020, 12:43 PM Pager   If 7PM-7AM, please contact night-coverage [email or web address]

## 2020-09-03 NOTE — Anesthesia Postprocedure Evaluation (Signed)
Anesthesia Post Note  Patient: Danna Hefty  Procedure(s) Performed: CYSTOSCOPY WITH RETROGRADE PYELOGRAM/URETERAL STENT PLACEMENT (Right Bladder)     Patient location during evaluation: PACU Anesthesia Type: General Level of consciousness: awake and alert Pain management: pain level controlled Vital Signs Assessment: post-procedure vital signs reviewed and stable Respiratory status: spontaneous breathing, nonlabored ventilation, respiratory function stable and patient connected to nasal cannula oxygen Cardiovascular status: blood pressure returned to baseline and stable Postop Assessment: no apparent nausea or vomiting Anesthetic complications: no   No complications documented.  Last Vitals:  Vitals:   09/02/20 2101 09/03/20 0549  BP: 134/81 (!) 144/92  Pulse: (!) 41 (!) 40  Resp: 16 16  Temp: 36.9 C 37.1 C  SpO2: 100% 100%    Last Pain:  Vitals:   09/03/20 0840  TempSrc:   PainSc: 9                  Hjalmer Iovino

## 2020-09-16 ENCOUNTER — Other Ambulatory Visit: Payer: Self-pay | Admitting: Urology

## 2020-09-16 DIAGNOSIS — N201 Calculus of ureter: Secondary | ICD-10-CM

## 2020-09-29 ENCOUNTER — Other Ambulatory Visit (HOSPITAL_COMMUNITY)
Admission: RE | Admit: 2020-09-29 | Discharge: 2020-09-29 | Disposition: A | Discharge status: Home / Self Care | Payer: No Typology Code available for payment source | Source: Ambulatory Visit | Attending: Urology | Admitting: Urology

## 2020-09-29 DIAGNOSIS — Z01812 Encounter for preprocedural laboratory examination: Secondary | ICD-10-CM | POA: Diagnosis present

## 2020-09-29 DIAGNOSIS — Z20822 Contact with and (suspected) exposure to covid-19: Secondary | ICD-10-CM | POA: Insufficient documentation

## 2020-09-29 NOTE — Progress Notes (Signed)
Patient to arrive at 1200 on 10/02/2020. History and medications reviewed. Pre-procedure instructions given. NPO after MN on Wednesday except for clear liquids until 1000. Driver secured.

## 2020-09-30 LAB — SARS CORONAVIRUS 2 (TAT 6-24 HRS): SARS Coronavirus 2: NEGATIVE

## 2020-10-01 NOTE — H&P (Signed)
Urology Consult   Physician requesting consult: Lacretia Leigh, MD  Reason for consult: Ureteral stone  History of Present Illness: Eric Farrell is a 60 y.o. male with a history of chronic kidney disease and kidney stones.  He presented to the emergency department today with a 48-hour history of worsening sharp, intermittent, nonradiating right-sided flank pain.  He denies any associated fever/chills, but does report nausea and emesis for the past 2 days.  CT stone study from today revealed an obstructing 7 mm right UPJ calculus with mild to moderate hydronephrosis.  He has passed multiple kidney stones in the past, but has never required surgery.  Currently, his pain is well managed and he denies any nausea.  The patient denies a history of voiding or storage urinary symptoms, hematuria, UTIs, STDs, GU malignancy/trauma/surgery.      Past Medical History:  Diagnosis Date  . Arthritis   . GERD (gastroesophageal reflux disease)   . Kidney stones   . Scoliosis          Past Surgical History:  Procedure Laterality Date  . COLONOSCOPY      Current Hospital Medications:  Home Meds:  Active Medications  No outpatient medications have been marked as taking for the 09/01/20 encounter [facility] Encounter).      Scheduled Meds: Continuous Infusions: . lactated ringers     PRN Meds:.ondansetron (ZOFRAN) IV  Allergies: No Known Allergies       Family History  Problem Relation Age of Onset  . Cancer Father   . Colon cancer Father   . Hypertension Other   . Esophageal cancer Neg Hx   . Stomach cancer Neg Hx   . Rectal cancer Neg Hx     Social History:  reports that he has never smoked. He has never used smokeless tobacco. He reports current drug use. Drug: Marijuana. He reports that he does not drink alcohol.  ROS: A complete review of systems was performed.  All systems are negative except for pertinent findings as noted.  Physical Exam:  Vital  signs in last 24 hours: Temp:  [98.4 F (36.9 C)-98.7 F (37.1 C)] 98.7 F (37.1 C) (04/18 1313) Pulse Rate:  [42-62] 57 (04/18 1545) Resp:  [10-25] 23 (04/18 1545) BP: (113-178)/(74-91) 113/74 (04/18 1530) SpO2:  [98 %-100 %] 99 % (04/18 1545) Weight:  [77.1 kg] 77.1 kg (04/18 1220) Constitutional:  Alert and oriented, No acute distress Cardiovascular: Regular rate and rhythm, No JVD Respiratory: Normal respiratory effort, Lungs clear bilaterally GI: Abdomen is soft, nontender, nondistended, no abdominal masses GU: No CVA tenderness Lymphatic: No lymphadenopathy Neurologic: Grossly intact, no focal deficits Psychiatric: Normal mood and affect  Laboratory Data:  Recent Labs (last 2 labs)      Recent Labs    09/01/20 1334  WBC 10.5  HGB 13.7  HCT 41.1  PLT 399      Recent Labs (last 2 labs)      Recent Labs    09/01/20 1334  NA 145  K 3.6  CL 109  GLUCOSE 127*  BUN 29*  CALCIUM 9.9  CREATININE 2.00*       Lab Results Last 24 Hours       Results for orders placed or performed during the hospital encounter of 09/01/20 (from the past 24 hour(s))  CBC with Differential/Platelet     Status: Abnormal   Collection Time: 09/01/20  1:34 PM  Result Value Ref Range   WBC 10.5 4.0 - 10.5 K/uL   RBC 4.61  4.22 - 5.81 MIL/uL   Hemoglobin 13.7 13.0 - 17.0 g/dL   HCT 41.1 39.0 - 52.0 %   MCV 89.2 80.0 - 100.0 fL   MCH 29.7 26.0 - 34.0 pg   MCHC 33.3 30.0 - 36.0 g/dL   RDW 14.7 11.5 - 15.5 %   Platelets 399 150 - 400 K/uL   nRBC 0.0 0.0 - 0.2 %   Neutrophils Relative % 87 %   Neutro Abs 9.2 (H) 1.7 - 7.7 K/uL   Lymphocytes Relative 8 %   Lymphs Abs 0.8 0.7 - 4.0 K/uL   Monocytes Relative 5 %   Monocytes Absolute 0.5 0.1 - 1.0 K/uL   Eosinophils Relative 0 %   Eosinophils Absolute 0.0 0.0 - 0.5 K/uL   Basophils Relative 0 %   Basophils Absolute 0.0 0.0 - 0.1 K/uL   Immature Granulocytes 0 %   Abs Immature Granulocytes 0.03 0.00 -  0.07 K/uL  Comprehensive metabolic panel     Status: Abnormal   Collection Time: 09/01/20  1:34 PM  Result Value Ref Range   Sodium 145 135 - 145 mmol/L   Potassium 3.6 3.5 - 5.1 mmol/L   Chloride 109 98 - 111 mmol/L   CO2 25 22 - 32 mmol/L   Glucose, Bld 127 (H) 70 - 99 mg/dL   BUN 29 (H) 6 - 20 mg/dL   Creatinine, Ser 2.00 (H) 0.61 - 1.24 mg/dL   Calcium 9.9 8.9 - 10.3 mg/dL   Total Protein 8.7 (H) 6.5 - 8.1 g/dL   Albumin 4.6 3.5 - 5.0 g/dL   AST 20 15 - 41 U/L   ALT 18 0 - 44 U/L   Alkaline Phosphatase 69 38 - 126 U/L   Total Bilirubin 1.1 0.3 - 1.2 mg/dL   GFR, Estimated 38 (L) >60 mL/min   Anion gap 11 5 - 15  Lipase, blood     Status: None   Collection Time: 09/01/20  1:34 PM  Result Value Ref Range   Lipase 30 11 - 51 U/L     No results found for this or any previous visit (from the past 240 hour(s)).  Renal Function:    Recent Labs    09/01/20 1334  CREATININE 2.00*   Estimated Creatinine Clearance: 42.1 mL/min (A) (by C-G formula based on SCr of 2 mg/dL (H)).  Radiologic Imaging:  Imaging Results (Last 48 hours)  CT Renal Stone Study  Result Date: 09/01/2020 CLINICAL DATA:  Vomiting with right flank pain for several days. Kidney stone suspected. EXAM: CT ABDOMEN AND PELVIS WITHOUT CONTRAST TECHNIQUE: Multidetector CT imaging of the abdomen and pelvis was performed following the standard protocol without IV contrast. COMPARISON:  Abdominopelvic CT 08/15/2020. FINDINGS: Lower chest: Clear lung bases. No significant pleural or pericardial effusion. Hepatobiliary: The liver is normal in density without suspicious focal abnormality. There are grossly stable cysts within the left hepatic lobe. No evidence of gallstones, gallbladder wall thickening or biliary dilatation. Pancreas: Unremarkable. No pancreatic ductal dilatation or surrounding inflammatory changes. Spleen: Normal in size without focal abnormality. Adrenals/Urinary Tract: Both adrenal  glands appear normal. The previously demonstrated 7 mm right renal calculus has passed into the proximal right ureter and is visible on the scout image at the level of the right L4 transverse process. This is causing proximal ureteral obstruction, manifesting as proximal collecting system dilatation and mild periureteral soft tissue stranding. No other urinary tract calculi are seen. There is a stable cyst in the upper pole of  the left kidney. The bladder appears unremarkable. Stomach/Bowel: No enteric contrast administered. The stomach appears unremarkable for its degree of distension. No evidence of bowel wall thickening, distention or surrounding inflammatory change. The appendix appears normal. Vascular/Lymphatic: There are no enlarged abdominal or pelvic lymph nodes. Aortic and branch vessel atherosclerosis with tortuosity. No acute vascular findings. Reproductive: The prostate gland appears unremarkable. Other: No evidence of abdominal wall mass or hernia. No ascites. Musculoskeletal: No acute or significant osseous findings. Multilevel spondylosis. IMPRESSION: 1. The previously demonstrated right renal calculus has passed into the proximal ureter and is causing collecting system obstruction. This measures 7 mm and is located at the level of the right L4 transverse process. 2. No other acute or significant findings. No additional urinary tract calculi. 3.  Aortic Atherosclerosis (ICD10-I70.0). Electronically Signed   By: Richardean Sale M.D.   On: 09/01/2020 14:42     I independently reviewed the above imaging studies.  Impression/Recommendation 60 year old male with an obstructing 7 mm right UPJ calculus with mild right-sided hydronephrosis associated with AKI.  -The risks, benefits and alternatives of cystoscopy with RIGHT JJ stent placement was discussed with the patient.  Risks include, but are not limited to: bleeding, urinary tract infection, ureteral injury, ureteral stricture disease,  chronic pain, urinary symptoms, bladder injury, stent migration, the need for nephrostomy tube placement, MI, CVA, DVT, PE and the inherent risks with general anesthesia.  The patient voices understanding and wishes to proceed.   -Plan for stent placement tomorrow afternoon(10/02/20)  Addendum: Patient s/p Cysto Right JJ stent 10/02/20-Now for ESL of Right proximal ureteral calculus

## 2020-10-02 ENCOUNTER — Ambulatory Visit (HOSPITAL_BASED_OUTPATIENT_CLINIC_OR_DEPARTMENT_OTHER)
Admission: RE | Admit: 2020-10-02 | Discharge: 2020-10-02 | Disposition: A | Discharge status: Home / Self Care | Payer: No Typology Code available for payment source | Attending: Urology | Admitting: Urology

## 2020-10-02 ENCOUNTER — Encounter (HOSPITAL_BASED_OUTPATIENT_CLINIC_OR_DEPARTMENT_OTHER): Payer: Self-pay | Admitting: Urology

## 2020-10-02 ENCOUNTER — Encounter (HOSPITAL_BASED_OUTPATIENT_CLINIC_OR_DEPARTMENT_OTHER)
Admission: RE | Disposition: A | Discharge status: Home / Self Care | Payer: Self-pay | Source: Home / Self Care | Attending: Urology

## 2020-10-02 ENCOUNTER — Ambulatory Visit (HOSPITAL_COMMUNITY): Payer: No Typology Code available for payment source

## 2020-10-02 ENCOUNTER — Other Ambulatory Visit: Payer: Self-pay

## 2020-10-02 DIAGNOSIS — N179 Acute kidney failure, unspecified: Secondary | ICD-10-CM | POA: Diagnosis not present

## 2020-10-02 DIAGNOSIS — N189 Chronic kidney disease, unspecified: Secondary | ICD-10-CM | POA: Insufficient documentation

## 2020-10-02 DIAGNOSIS — N201 Calculus of ureter: Secondary | ICD-10-CM

## 2020-10-02 DIAGNOSIS — N132 Hydronephrosis with renal and ureteral calculous obstruction: Secondary | ICD-10-CM | POA: Insufficient documentation

## 2020-10-02 DIAGNOSIS — Z87442 Personal history of urinary calculi: Secondary | ICD-10-CM | POA: Diagnosis not present

## 2020-10-02 HISTORY — PX: EXTRACORPOREAL SHOCK WAVE LITHOTRIPSY: SHX1557

## 2020-10-02 SURGERY — LITHOTRIPSY, ESWL
Anesthesia: LOCAL | Laterality: Right

## 2020-10-02 MED ORDER — DIPHENHYDRAMINE HCL 25 MG PO CAPS
ORAL_CAPSULE | ORAL | Status: AC
  Filled 2020-10-02: qty 1

## 2020-10-02 MED ORDER — SODIUM CHLORIDE 0.9 % IV SOLN
INTRAVENOUS | Status: DC
  Administered 2020-10-02: 1000 mL via INTRAVENOUS

## 2020-10-02 MED ORDER — TAMSULOSIN HCL 0.4 MG PO CAPS: 0.4000 mg | ORAL_CAPSULE | Freq: Every day | ORAL | 0 refills | Status: AC

## 2020-10-02 MED ORDER — CIPROFLOXACIN HCL 500 MG PO TABS
ORAL_TABLET | ORAL | Status: AC
  Filled 2020-10-02: qty 1

## 2020-10-02 MED ORDER — DIAZEPAM 5 MG PO TABS
ORAL_TABLET | ORAL | Status: AC
  Filled 2020-10-02: qty 2

## 2020-10-02 MED ORDER — DIAZEPAM 5 MG PO TABS
10.0000 mg | ORAL_TABLET | ORAL | Status: AC
  Administered 2020-10-02: 10 mg via ORAL

## 2020-10-02 MED ORDER — DIPHENHYDRAMINE HCL 25 MG PO CAPS
25.0000 mg | ORAL_CAPSULE | ORAL | Status: AC
  Administered 2020-10-02: 25 mg via ORAL

## 2020-10-02 MED ORDER — CIPROFLOXACIN HCL 500 MG PO TABS
500.0000 mg | ORAL_TABLET | ORAL | Status: AC
  Administered 2020-10-02: 500 mg via ORAL

## 2020-10-02 NOTE — Discharge Instructions (Signed)

## 2020-10-02 NOTE — Brief Op Note (Signed)
10/02/2020  3:36 PM  PATIENT:  Eric Farrell  59 y.o. male  PRE-OPERATIVE DIAGNOSIS:  RIGHT URETERAL STONE  POST-OPERATIVE DIAGNOSIS:  SAME  PROCEDURE:  Procedure(s): EXTRACORPOREAL SHOCK WAVE LITHOTRIPSY (ESWL) (Right)  SURGEON:  Surgeon(s) and Role:    * Demon Volante B, MD - Primary  PHYSICIAN ASSISTANT:   ASSISTANTS: none   ANESTHESIA:   IV sedation  EBL:  minimal   BLOOD ADMINISTERED:none  DRAINS: none   LOCAL MEDICATIONS USED:  NONE  SPECIMEN:  No Specimen  DISPOSITION OF SPECIMEN:  N/A  COUNTS:  YES  TOURNIQUET:  * No tourniquets in log *  DICTATION: .Note written in EPIC  PLAN OF CARE: Discharge to home after PACU  PATIENT DISPOSITION:  PACU - hemodynamically stable.   Delay start of Pharmacological VTE agent (>24hrs) due to surgical blood loss or risk of bleeding: not applicable 

## 2020-10-02 NOTE — Op Note (Signed)
10/02/2020  3:36 PM  PATIENT:  Eric Farrell  60 y.o. male  PRE-OPERATIVE DIAGNOSIS:  RIGHT URETERAL STONE  POST-OPERATIVE DIAGNOSIS:  SAME  PROCEDURE:  Procedure(s): EXTRACORPOREAL SHOCK WAVE LITHOTRIPSY (ESWL) (Right)  SURGEON:  Surgeon(s) and Role:    * [name], MD - Primary  PHYSICIAN ASSISTANT:   ASSISTANTS: none   ANESTHESIA:   IV sedation  EBL:  minimal   BLOOD ADMINISTERED:none  DRAINS: none   LOCAL MEDICATIONS USED:  NONE  SPECIMEN:  No Specimen  DISPOSITION OF SPECIMEN:  N/A  COUNTS:  YES  TOURNIQUET:  * No tourniquets in log *  DICTATION: .Note written in EPIC  PLAN OF CARE: Discharge to home after PACU  PATIENT DISPOSITION:  PACU - hemodynamically stable.   Delay start of Pharmacological VTE agent (>24hrs) due to surgical blood loss or risk of bleeding: not applicable

## 2020-10-02 NOTE — Interval H&P Note (Signed)
History and Physical Interval Note:  10/02/2020 2:37 PM  Eric Farrell  has presented today for surgery, with the diagnosis of RIGHT URETERAL STONE.  The various methods of treatment have been discussed with the patient and family. After consideration of risks, benefits and other options for treatment, the patient has consented to  Procedure(s): EXTRACORPOREAL SHOCK WAVE LITHOTRIPSY (ESWL) (Right) as a surgical intervention.  The patient's history has been reviewed, patient examined, no change in status, stable for surgery.  I have reviewed the patient's chart and labs.  Questions were answered to the patient's satisfaction.     [name]

## 2020-10-03 ENCOUNTER — Encounter (HOSPITAL_BASED_OUTPATIENT_CLINIC_OR_DEPARTMENT_OTHER): Payer: Self-pay | Admitting: Urology

## 2021-12-09 IMAGING — CT CT RENAL STONE PROTOCOL
2 of 4 series · 16 of 46 positions shown, 18 images · non-contrast
Comparison: Abdominopelvic CT 08/15/2020.

CLINICAL DATA: Vomiting with right flank pain for several days.
Kidney stone suspected.

EXAM:
CT ABDOMEN AND PELVIS WITHOUT CONTRAST
TECHNIQUE: Multidetector CT imaging of the abdomen and pelvis was performed
following the standard protocol without IV contrast.

[Series 2: axial st · axial · 0.69mm/px · z∈[-469,-109]mm · 13 of 82 slices shown, 15 images]
[im 5/82  soft-tissue]
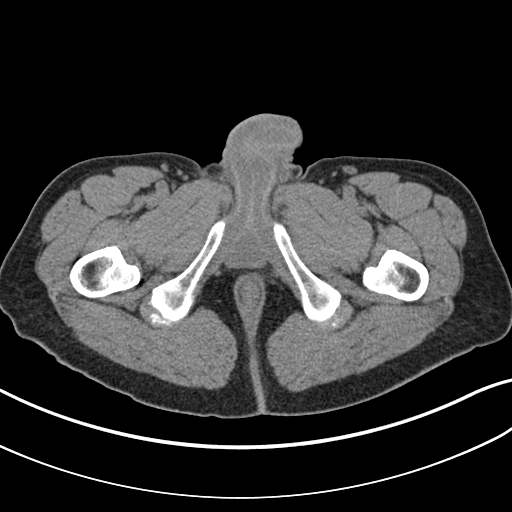
[im 5/82  bone]
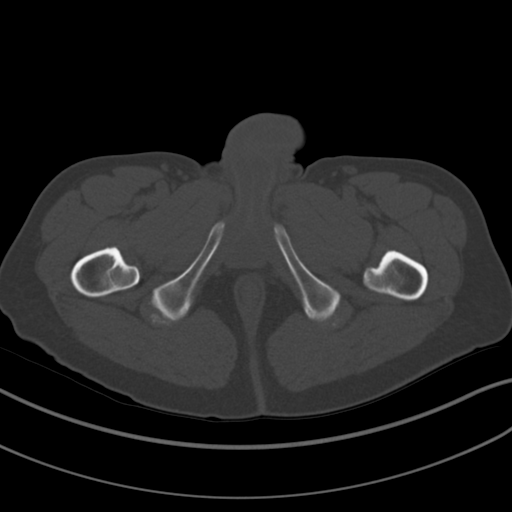
[im 13/82  soft-tissue]
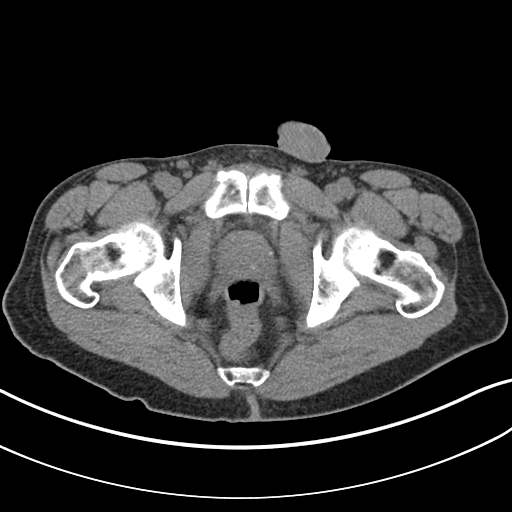
[im 17/82  soft-tissue]
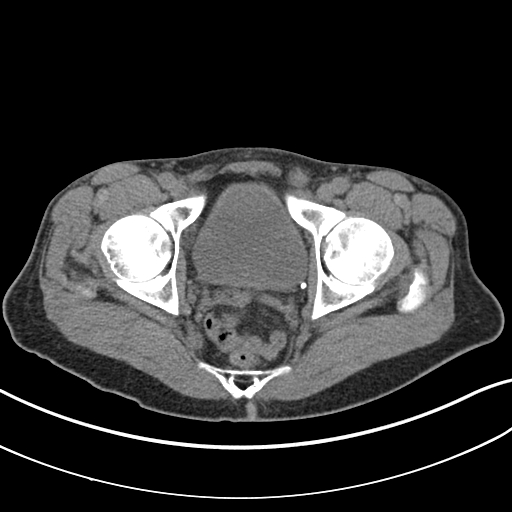
[im 25/82  soft-tissue]
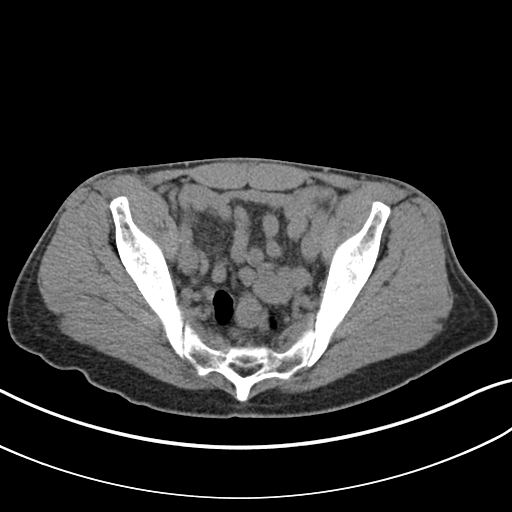
[im 29/82  soft-tissue]
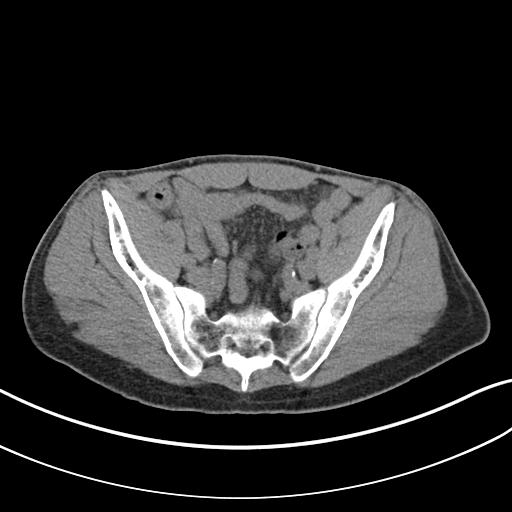
[im 37/82  soft-tissue]
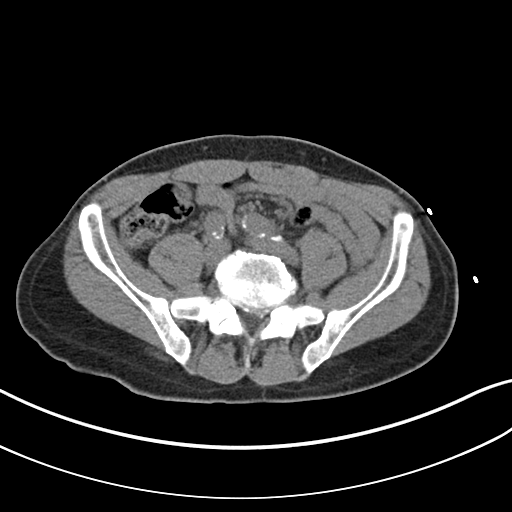
[im 41/82  soft-tissue]
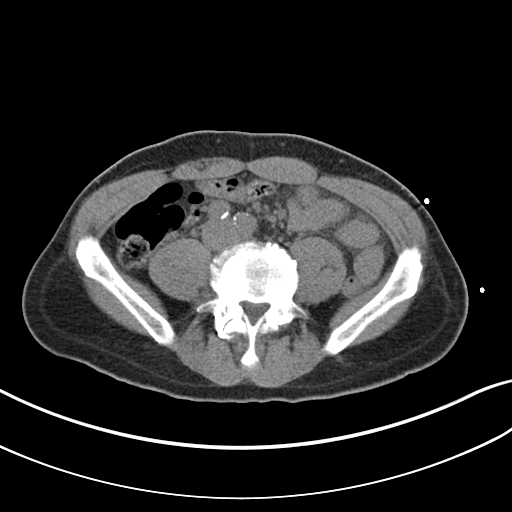
[im 45/82  soft-tissue]
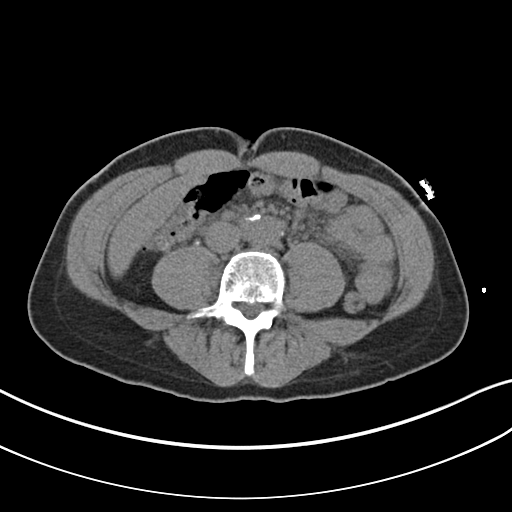
[im 53/82  soft-tissue]
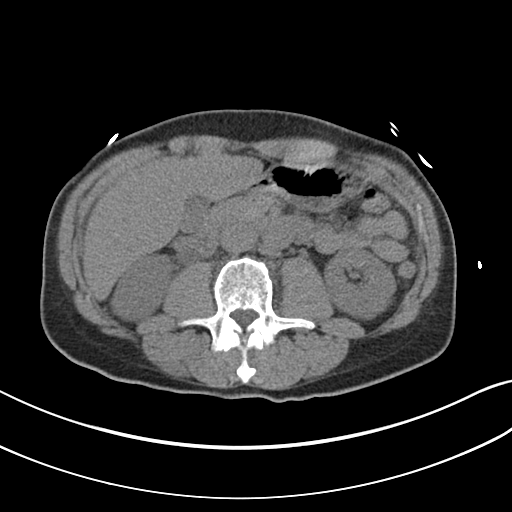
[im 53/82  bone]
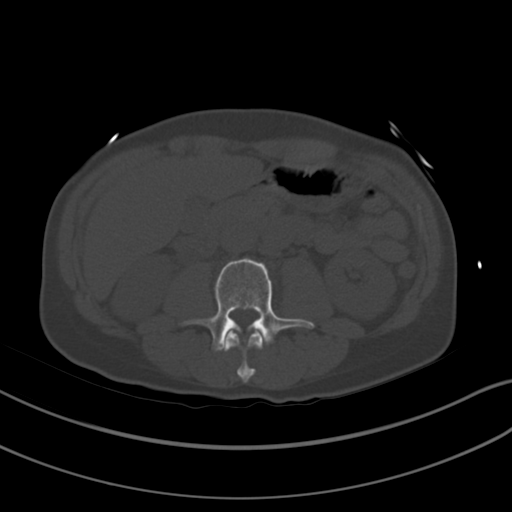
[im 57/82  soft-tissue]
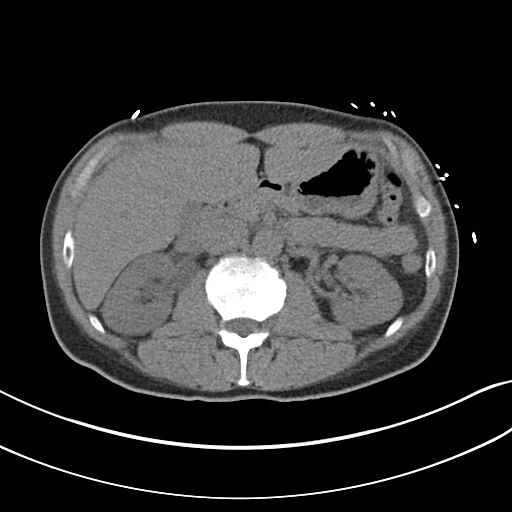
[im 65/82  soft-tissue]
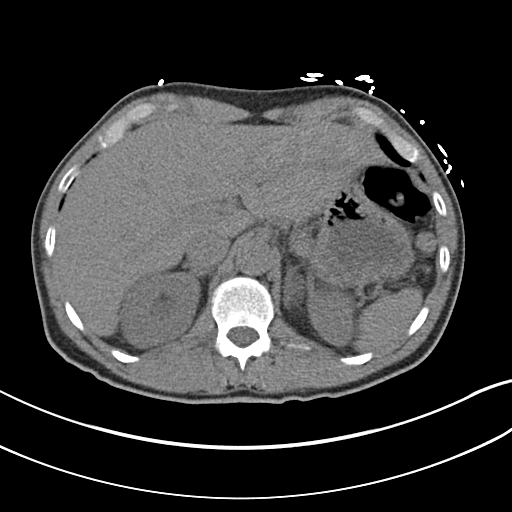
[im 69/82  soft-tissue]
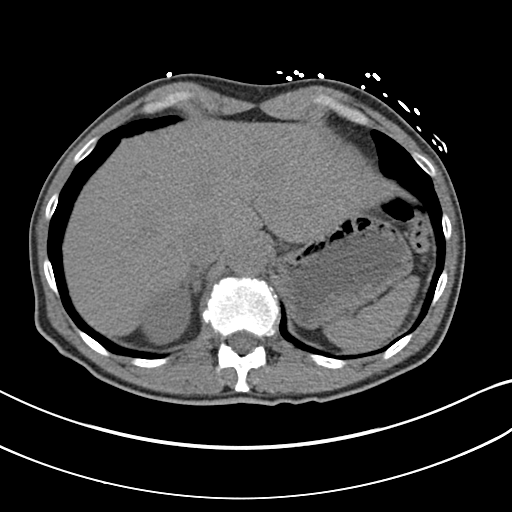
[im 77/82  soft-tissue]
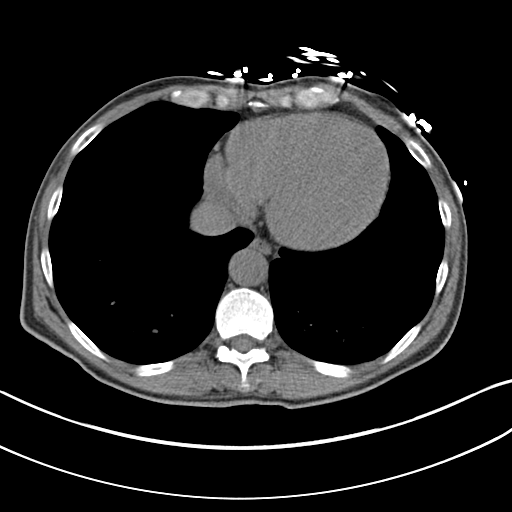

[Series 4: coronal · coronal · 0.66mm/px · 3 of 125 slices shown]
[im 42/125  soft-tissue]
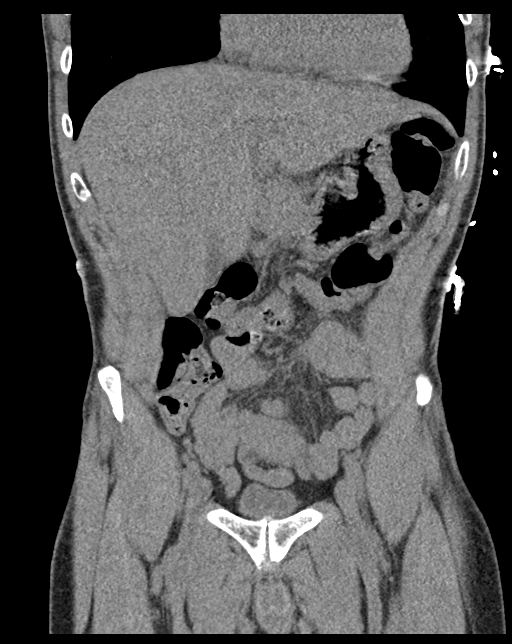
[im 56/125  soft-tissue]
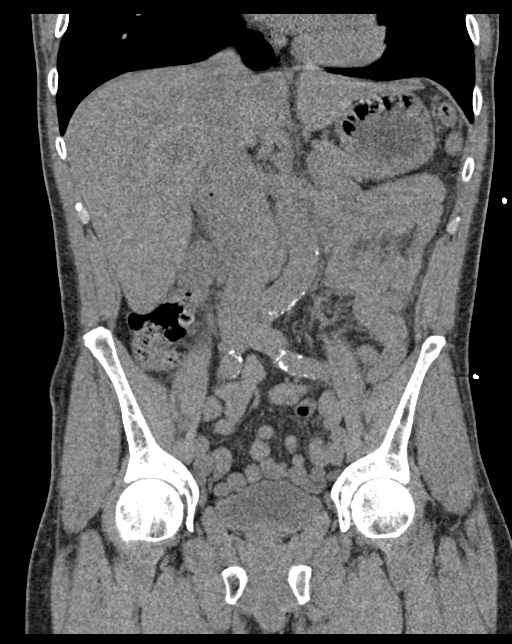
[im 69/125  soft-tissue]
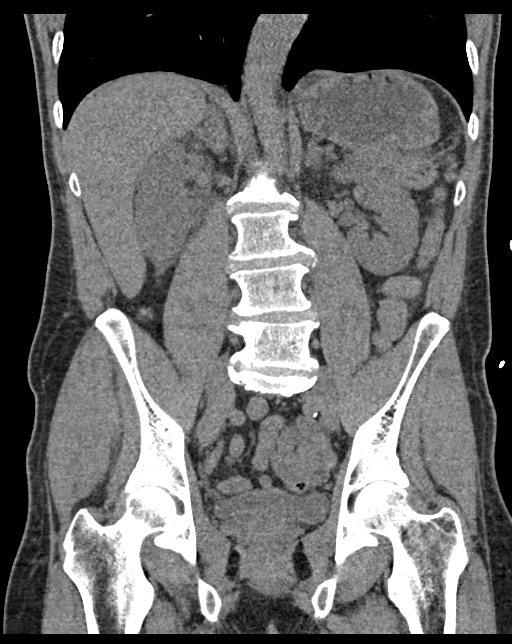

[16 of 46 positions shown; findings below may reference images not displayed]

FINDINGS: Lower chest: Clear lung bases. No significant pleural or pericardial
effusion.

Hepatobiliary: The liver is normal in density without suspicious
focal abnormality. There are grossly stable cysts within the left
hepatic lobe. No evidence of gallstones, gallbladder wall thickening
or biliary dilatation.

Pancreas: Unremarkable. No pancreatic ductal dilatation or
surrounding inflammatory changes.

Spleen: Normal in size without focal abnormality.

Adrenals/Urinary Tract: Both adrenal glands appear normal. The
previously demonstrated 7 mm right renal calculus has passed into
the proximal right ureter and is visible on the scout image at the
level of the right L4 transverse process. This is causing proximal
ureteral obstruction, manifesting as proximal collecting system
dilatation and mild periureteral soft tissue stranding. No other
urinary tract calculi are seen. There is a stable cyst in the upper
pole of the left kidney. The bladder appears unremarkable.

Stomach/Bowel: No enteric contrast administered. The stomach appears
unremarkable for its degree of distension. No evidence of bowel wall
thickening, distention or surrounding inflammatory change. The
appendix appears normal.

Vascular/Lymphatic: There are no enlarged abdominal or pelvic lymph
nodes. Aortic and branch vessel atherosclerosis with tortuosity. No
acute vascular findings.

Reproductive: The prostate gland appears unremarkable.

Other: No evidence of abdominal wall mass or hernia. No ascites.

Musculoskeletal: No acute or significant osseous findings.
Multilevel spondylosis.
IMPRESSION: 1. The previously demonstrated right renal calculus has passed into
the proximal ureter and is causing collecting system obstruction.
This measures 7 mm and is located at the level of the right L4
transverse process.
2. No other acute or significant findings. No additional urinary
tract calculi.
3.  Aortic Atherosclerosis (23727-W05.5).

## 2022-09-28 ENCOUNTER — Encounter (HOSPITAL_COMMUNITY): Payer: Self-pay

## 2022-09-28 ENCOUNTER — Other Ambulatory Visit: Payer: Self-pay

## 2022-09-28 ENCOUNTER — Emergency Department (HOSPITAL_COMMUNITY)
Admission: EM | Admit: 2022-09-28 | Discharge: 2022-09-28 | Disposition: A | Discharge status: Home / Self Care | Payer: No Typology Code available for payment source | Attending: Emergency Medicine | Admitting: Emergency Medicine

## 2022-09-28 ENCOUNTER — Emergency Department (HOSPITAL_COMMUNITY): Payer: No Typology Code available for payment source

## 2022-09-28 DIAGNOSIS — N451 Epididymitis: Secondary | ICD-10-CM | POA: Diagnosis not present

## 2022-09-28 DIAGNOSIS — D72829 Elevated white blood cell count, unspecified: Secondary | ICD-10-CM | POA: Insufficient documentation

## 2022-09-28 DIAGNOSIS — N50811 Right testicular pain: Secondary | ICD-10-CM | POA: Diagnosis present

## 2022-09-28 LAB — CBC WITH DIFFERENTIAL/PLATELET
Abs Immature Granulocytes: 0.04 10*3/uL (ref 0.00–0.07)
Basophils Absolute: 0.1 10*3/uL (ref 0.0–0.1)
Basophils Relative: 0 %
Eosinophils Absolute: 0 10*3/uL (ref 0.0–0.5)
Eosinophils Relative: 0 %
HCT: 44.8 % (ref 39.0–52.0)
Hemoglobin: 14.7 g/dL (ref 13.0–17.0)
Immature Granulocytes: 0 %
Lymphocytes Relative: 11 %
Lymphs Abs: 1.7 10*3/uL (ref 0.7–4.0)
MCH: 29.8 pg (ref 26.0–34.0)
MCHC: 32.8 g/dL (ref 30.0–36.0)
MCV: 90.9 fL (ref 80.0–100.0)
Monocytes Absolute: 1 10*3/uL (ref 0.1–1.0)
Monocytes Relative: 6 %
Neutro Abs: 13 10*3/uL — ABNORMAL HIGH (ref 1.7–7.7)
Neutrophils Relative %: 83 %
Platelets: 334 10*3/uL (ref 150–400)
RBC: 4.93 MIL/uL (ref 4.22–5.81)
RDW: 13.6 % (ref 11.5–15.5)
WBC: 15.8 10*3/uL — ABNORMAL HIGH (ref 4.0–10.5)
nRBC: 0 % (ref 0.0–0.2)

## 2022-09-28 LAB — URINALYSIS, W/ REFLEX TO CULTURE (INFECTION SUSPECTED)
Bilirubin Urine: NEGATIVE
Glucose, UA: NEGATIVE mg/dL
Ketones, ur: NEGATIVE mg/dL
Leukocytes,Ua: NEGATIVE
Nitrite: NEGATIVE
Protein, ur: 100 mg/dL — AB
Specific Gravity, Urine: 1.028 (ref 1.005–1.030)
pH: 5 (ref 5.0–8.0)

## 2022-09-28 LAB — COMPREHENSIVE METABOLIC PANEL
ALT: 15 U/L (ref 0–44)
AST: 18 U/L (ref 15–41)
Albumin: 4.1 g/dL (ref 3.5–5.0)
Alkaline Phosphatase: 79 U/L (ref 38–126)
Anion gap: 10 (ref 5–15)
BUN: 15 mg/dL (ref 6–20)
CO2: 25 mmol/L (ref 22–32)
Calcium: 9.4 mg/dL (ref 8.9–10.3)
Chloride: 105 mmol/L (ref 98–111)
Creatinine, Ser: 1.27 mg/dL — ABNORMAL HIGH (ref 0.61–1.24)
GFR, Estimated: >60 mL/min (ref >60)
Glucose, Bld: 151 mg/dL — ABNORMAL HIGH (ref 70–99)
Potassium: 3.8 mmol/L (ref 3.5–5.1)
Sodium: 140 mmol/L (ref 135–145)
Total Bilirubin: 1.4 mg/dL — ABNORMAL HIGH (ref 0.3–1.2)
Total Protein: 9 g/dL — ABNORMAL HIGH (ref 6.5–8.1)

## 2022-09-28 LAB — HIV ANTIBODY (ROUTINE TESTING W REFLEX): HIV Screen 4th Generation wRfx: NONREACTIVE

## 2022-09-28 MED ORDER — LACTATED RINGERS IV BOLUS
1000.0000 mL | Freq: Once | INTRAVENOUS | Status: AC
  Administered 2022-09-28: 1000 mL via INTRAVENOUS

## 2022-09-28 MED ORDER — ONDANSETRON 4 MG PO TBDP: 4.0000 mg | ORAL_TABLET | Freq: Three times a day (TID) | ORAL | 0 refills | Status: AC | PRN

## 2022-09-28 MED ORDER — ONDANSETRON HCL 4 MG/2ML IJ SOLN
4.0000 mg | Freq: Once | INTRAMUSCULAR | Status: AC
  Administered 2022-09-28: 4 mg via INTRAVENOUS
  Filled 2022-09-28: qty 2

## 2022-09-28 MED ORDER — SODIUM CHLORIDE 0.9 % IV SOLN
2.0000 g | Freq: Once | INTRAVENOUS | Status: AC
  Administered 2022-09-28: 2 g via INTRAVENOUS
  Filled 2022-09-28: qty 20

## 2022-09-28 MED ORDER — HYDROCODONE-ACETAMINOPHEN 5-325 MG PO TABS: 1.0000 | ORAL_TABLET | ORAL | 0 refills | Status: AC | PRN

## 2022-09-28 MED ORDER — AMOXICILLIN-POT CLAVULANATE 875-125 MG PO TABS: 1.0000 | ORAL_TABLET | Freq: Two times a day (BID) | ORAL | 0 refills | Status: AC

## 2022-09-28 MED ORDER — HYDROMORPHONE HCL 1 MG/ML IJ SOLN
1.0000 mg | Freq: Once | INTRAMUSCULAR | Status: AC
  Administered 2022-09-28: 1 mg via INTRAVENOUS
  Filled 2022-09-28: qty 1

## 2022-09-28 MED ORDER — IBUPROFEN 400 MG PO TABS: 400.0000 mg | ORAL_TABLET | Freq: Three times a day (TID) | ORAL | 0 refills | Status: AC | PRN

## 2022-09-28 NOTE — ED Triage Notes (Signed)
Testicle enlarged causing pain into lower back.

## 2022-09-28 NOTE — Discharge Instructions (Addendum)
Your ultrasound today shows an infection of your epididymis.  This is called epididymitis and can be quite painful and can result in swelling.  You are being given 2 weeks of antibiotics to help fully treat this.  You were given an IV dose of antibiotics today and can start the antibiotics tomorrow.  If you develop new or worsening pain, new or worsening swelling, fever, or any other new/concerning symptoms and return to the ER or call 911.  Otherwise call your urologist in the morning to set up close outpatient follow-up.

## 2022-09-28 NOTE — ED Provider Notes (Signed)
[place] EMERGENCY DEPARTMENT AT [facility] Provider Note   CSN: [id number] Arrival date & time: 09/28/22  0757     History  Chief Complaint  Patient presents with   Groin Swelling    Eric Farrell is a 62 y.o. male.  HPI 62 year old male with a history of previous kidney stones, scoliosis, GERD and arthritis presents with right testicular pain and swelling.  About 3-4 days ago he was having intercourse and developed sudden right testicle pain.  The next day he noticed that his testicle was swollen and feels like it has been getting worse ever since.  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  The pain seems to be getting worse and has caused him to vomit several times.  He had a couple episodes of diarrhea.  He feels like he is having some dysuria though no urethral discharge.  He has low concern for STI at this time.  No penile pain.  He does have some low back pain which is exacerbated by the pain he is having today but not a new pain.  He feels like his lower abdomen is also sore but he thinks that is coming from the testicular pain.  Right now his pain is an 8 out of 10.  Home Medications Prior to Admission medications   Medication Sig Start Date End Date Taking? Authorizing Provider  amoxicillin-clavulanate (AUGMENTIN) 875-125 MG tablet Take 1 tablet by mouth every 12 (twelve) hours for 14 days. 09/28/22 10/12/22 Yes Pricilla Loveless, MD  HYDROcodone-acetaminophen (NORCO) 5-325 MG tablet Take 1 tablet by mouth every 4 (four) hours as needed for severe pain. 09/28/22  Yes Pricilla Loveless, MD  ibuprofen (ADVIL) 400 MG tablet Take 1 tablet (400 mg total) by mouth every 8 (eight) hours as needed. 09/28/22  Yes Pricilla Loveless, MD  ondansetron (ZOFRAN-ODT) 4 MG disintegrating tablet Take 1 tablet (4 mg total) by mouth every 8 (eight) hours as needed for nausea or vomiting. 09/28/22  Yes Pricilla Loveless, MD  pantoprazole  (PROTONIX) 40 MG tablet Take 40 mg by mouth daily. 10/16/19  Yes [provider]  acetaminophen (TYLENOL) 500 MG tablet Take 1,000 mg by mouth once as needed. Patient not taking: Reported on 09/28/2022    [provider]  tamsulosin (FLOMAX) 0.4 MG CAPS capsule Take 1 capsule (0.4 mg total) by mouth daily. Patient not taking: Reported on 09/28/2022 10/02/20   Belva Agee, MD  promethazine (PHENERGAN) 25 MG tablet Take 1 tablet (25 mg total) by mouth every 6 (six) hours as needed for nausea or vomiting. Patient not taking: Reported on 05/08/2018 01/09/16 10/29/19  Gilda Crease, MD  ranitidine (ZANTAC) 150 MG tablet Take 1 tablet (150 mg total) by mouth 2 (two) times daily. Patient not taking: Reported on 05/08/2018 04/07/17 10/29/19  Emi Holes, PA-C  sucralfate (CARAFATE) 1 GM/10ML suspension Take 1 g by mouth 4 (four) times daily -  with meals and at bedtime.  10/29/19  [provider]      Allergies    Patient has no known allergies.    Review of Systems   Review of Systems  Gastrointestinal:  Positive for abdominal pain, diarrhea, nausea and vomiting.  Genitourinary:  Positive for dysuria, scrotal swelling and testicular pain. Negative for penile discharge and penile pain.  Musculoskeletal:  Positive for back pain.    Physical Exam Updated Vital Signs BP (!) 131/90 (BP Location:  Right Arm)   Pulse (!) 58   Temp 98 F (36.7 C) (Oral)   Resp 18   Ht 6' (1.829 m)   Wt 63.5 kg   SpO2 100%   BMI 18.99 kg/m  Physical Exam Vitals and nursing note reviewed. Exam conducted with a chaperone present.  Constitutional:      Appearance: He is well-developed.  HENT:     Head: Normocephalic and atraumatic.  Cardiovascular:     Rate and Rhythm: Normal rate and regular rhythm.     Heart sounds: Normal heart sounds.  Pulmonary:     Effort: Pulmonary effort is normal.     Breath sounds: Normal breath sounds.  Abdominal:     Palpations: Abdomen is  soft.     Tenderness: There is abdominal tenderness (mild) in the suprapubic area. There is no right CVA tenderness or left CVA tenderness.  Genitourinary:    Penis: No erythema, tenderness, discharge or swelling.      Testes:        Right: Tenderness and swelling present.        Left: Tenderness or swelling not present.  Skin:    General: Skin is warm and dry.  Neurological:     Mental Status: He is alert.     ED Results / Procedures / Treatments   Labs (all labs ordered are listed, but only abnormal results are displayed) Labs Reviewed  COMPREHENSIVE METABOLIC PANEL - Abnormal; Notable for the following components:      Result Value   Glucose, Bld 151 (*)    Creatinine, Ser 1.27 (*)    Total Protein 9.0 (*)    Total Bilirubin 1.4 (*)    All other components within normal limits  CBC WITH DIFFERENTIAL/PLATELET - Abnormal; Notable for the following components:   WBC 15.8 (*)    Neutro Abs 13.0 (*)    All other components within normal limits  URINALYSIS, W/ REFLEX TO CULTURE (INFECTION SUSPECTED) - Abnormal; Notable for the following components:   Hgb urine dipstick SMALL (*)    Protein, ur 100 (*)    Bacteria, UA RARE (*)    All other components within normal limits  HIV ANTIBODY (ROUTINE TESTING W REFLEX)  RPR  GC/CHLAMYDIA PROBE AMP ([place]) NOT AT [facility]    EKG None  Radiology US SCROTUM W/DOPPLER  Result Date: 09/28/2022 CLINICAL DATA:  62 year old male with RIGHT testicular pain and swelling. EXAM: SCROTAL ULTRASOUND DOPPLER ULTRASOUND OF THE TESTICLES TECHNIQUE: Complete ultrasound examination of the testicles, epididymis, and other scrotal structures was performed. Color and spectral Doppler ultrasound were also utilized to evaluate blood flow to the testicles. COMPARISON:  None Available. FINDINGS: Right testicle Measurements: 2.7 x 2.2 x 2.5 cm. No mass or microlithiasis visualized. Left testicle Measurements: 3.3 x 1.8 x 2 cm. No mass or microlithiasis  visualized. Right epididymis: The RIGHT epididymis is enlarged with increased vascular flow. Left epididymis:  Normal in size and appearance. Hydrocele: A moderate to large complicated RIGHT hydrocele is noted containing septations. There is no evidence of LEFT hydrocele. Varicocele:  None visualized. Pulsed Doppler interrogation of both testes demonstrates normal low resistance arterial and venous waveforms bilaterally. IMPRESSION: 1. Enlarged RIGHT epididymis with increased vascular flow, likely representing epididymitis. Clinical follow-up recommended. 2. Moderate to large complicated RIGHT hydrocele containing septations likely related to infection/inflammation given probable RIGHT epididymitis finding. 3. Normal testicles. No evidence of testicular torsion or mass. Electronically Signed   By: Harmon Pier M.D.   On:  09/28/2022 09:12    Procedures Procedures    Medications Ordered in ED Medications  lactated ringers bolus 1,000 mL (0 mLs Intravenous Stopped 09/28/22 1056)  HYDROmorphone (DILAUDID) injection 1 mg (1 mg Intravenous Given 09/28/22 0849)  ondansetron (ZOFRAN) injection 4 mg (4 mg Intravenous Given 09/28/22 0848)  cefTRIAXone (ROCEPHIN) 2 g in sodium chloride 0.9 % 100 mL IVPB (0 g Intravenous Stopped 09/28/22 1136)    ED Course/ Medical Decision Making/ A&P                             Medical Decision Making Amount and/or Complexity of Data Reviewed Labs: ordered.    Details: Leukocytosis. Radiology: ordered and independent interpretation performed.    Details: U/S with hypervascularity of right testicle. Has a hydrocele (likely reactive).   Risk Prescription drug management.   Patient was given IV pain control and fluids. Feels a lot better. I discussed patient with Dr. Berneice Heinrich of Urology. He has viewed the images and advises 2 g IV rocephin, and 2 weeks of either Augmentin or bactrim. Recommends giving a trial of outpatient therapy, but if he worsens, especially with fever,  then he needs to return and potentially get IV antibiotics.   He has a low concern for STI and so will hold off on treatment for GC/chlamydia but though this has been sent by urine testing.  Otherwise, he follows with urology at the [facility] would like to follow-up with them and will call them tomorrow.  He was given return precautions.        Final Clinical Impression(s) / ED Diagnoses Final diagnoses:  Epididymitis    Rx / DC Orders ED Discharge Orders          Ordered    HYDROcodone-acetaminophen (NORCO) 5-325 MG tablet  Every 4 hours PRN        09/28/22 1231    ibuprofen (ADVIL) 400 MG tablet  Every 8 hours PRN        09/28/22 1231    amoxicillin-clavulanate (AUGMENTIN) 875-125 MG tablet  Every 12 hours        09/28/22 1231    ondansetron (ZOFRAN-ODT) 4 MG disintegrating tablet  Every 8 hours PRN        09/28/22 1303              Pricilla Loveless, MD 09/28/22 1440

## 2022-09-29 LAB — GC/CHLAMYDIA PROBE AMP (~~LOC~~) NOT AT ARMC
Chlamydia: NEGATIVE
Comment: NEGATIVE
Comment: NORMAL
Neisseria Gonorrhea: NEGATIVE

## 2022-09-29 LAB — RPR: RPR Ser Ql: NONREACTIVE

## 2023-03-21 ENCOUNTER — Emergency Department (HOSPITAL_COMMUNITY)
Admission: EM | Admit: 2023-03-21 | Discharge: 2023-03-22 | Discharge status: Against Medical Advice | Payer: No Typology Code available for payment source | Attending: Emergency Medicine | Admitting: Emergency Medicine

## 2023-03-21 ENCOUNTER — Other Ambulatory Visit: Payer: Self-pay

## 2023-03-21 ENCOUNTER — Encounter (HOSPITAL_COMMUNITY): Payer: Self-pay

## 2023-03-21 DIAGNOSIS — R112 Nausea with vomiting, unspecified: Secondary | ICD-10-CM | POA: Insufficient documentation

## 2023-03-21 DIAGNOSIS — R1030 Lower abdominal pain, unspecified: Secondary | ICD-10-CM | POA: Diagnosis present

## 2023-03-21 DIAGNOSIS — R197 Diarrhea, unspecified: Secondary | ICD-10-CM | POA: Diagnosis not present

## 2023-03-21 DIAGNOSIS — Z5321 Procedure and treatment not carried out due to patient leaving prior to being seen by health care provider: Secondary | ICD-10-CM | POA: Insufficient documentation

## 2023-03-21 LAB — COMPREHENSIVE METABOLIC PANEL
ALT: 20 U/L (ref 0–44)
AST: 21 U/L (ref 15–41)
Albumin: 4.9 g/dL (ref 3.5–5.0)
Alkaline Phosphatase: 82 U/L (ref 38–126)
Anion gap: 15 (ref 5–15)
BUN: 27 mg/dL — ABNORMAL HIGH (ref 6–20)
CO2: 24 mmol/L (ref 22–32)
Calcium: 10.2 mg/dL (ref 8.9–10.3)
Chloride: 107 mmol/L (ref 98–111)
Creatinine, Ser: 1.52 mg/dL — ABNORMAL HIGH (ref 0.61–1.24)
GFR, Estimated: 52 mL/min — ABNORMAL LOW (ref >60)
Glucose, Bld: 150 mg/dL — ABNORMAL HIGH (ref 70–99)
Potassium: 4 mmol/L (ref 3.5–5.1)
Sodium: 146 mmol/L — ABNORMAL HIGH (ref 135–145)
Total Bilirubin: 1.1 mg/dL (ref <1.2)
Total Protein: 9.3 g/dL — ABNORMAL HIGH (ref 6.5–8.1)

## 2023-03-21 LAB — CBC
HCT: 46.5 % (ref 39.0–52.0)
Hemoglobin: 15.6 g/dL (ref 13.0–17.0)
MCH: 30.5 pg (ref 26.0–34.0)
MCHC: 33.5 g/dL (ref 30.0–36.0)
MCV: 91 fL (ref 80.0–100.0)
Platelets: 288 10*3/uL (ref 150–400)
RBC: 5.11 MIL/uL (ref 4.22–5.81)
RDW: 14.4 % (ref 11.5–15.5)
WBC: 7.4 10*3/uL (ref 4.0–10.5)
nRBC: 0 % (ref 0.0–0.2)

## 2023-03-21 LAB — LIPASE, BLOOD: Lipase: 23 U/L (ref 11–51)

## 2023-03-21 MED ORDER — ONDANSETRON 4 MG PO TBDP
4.0000 mg | ORAL_TABLET | Freq: Once | ORAL | Status: AC | PRN
  Administered 2023-03-21: 4 mg via ORAL
  Filled 2023-03-21: qty 1

## 2023-03-21 NOTE — ED Triage Notes (Signed)
Pt POV d/t lower ABD pain with N/V/D for 2 days.

## 2023-03-22 NOTE — ED Notes (Signed)
Pt called 3 x to be roomed with no answer

## 2024-03-14 ENCOUNTER — Encounter: Payer: Self-pay | Admitting: Gastroenterology

## 2024-04-17 ENCOUNTER — Encounter

## 2024-04-17 ENCOUNTER — Telehealth: Payer: Self-pay

## 2024-04-17 NOTE — Telephone Encounter (Signed)
 RN called patient a second time. Patient states he is sick and would like to reschedule previsit.  Previsit rescheduled for Thursday 04/19/2024 at 11:00 AM in [facility]. Patient verbalizes understanding.

## 2024-04-17 NOTE — Telephone Encounter (Signed)
 RN attempted to call patient, no answer. LVM RN would call back in 10 minutes.

## 2024-04-19 ENCOUNTER — Encounter

## 2024-04-20 ENCOUNTER — Ambulatory Visit

## 2024-04-20 VITALS — Ht 72.0 in | Wt 144.0 lb

## 2024-04-20 DIAGNOSIS — Z860100 Personal history of colon polyps, unspecified: Secondary | ICD-10-CM

## 2024-04-20 MED ORDER — BISACODYL EC 5 MG PO TBEC: 5.0000 mg | DELAYED_RELEASE_TABLET | ORAL | 0 refills | Status: AC

## 2024-04-20 MED ORDER — PEG 3350-KCL-NA BICARB-NACL 420 G PO SOLR: 4000.0000 mL | Freq: Once | ORAL | 0 refills | Status: AC

## 2024-04-20 NOTE — Progress Notes (Signed)
 Pt's name and DOB verified at the beginning of the pre-visit with 2 identifiers  Pt denies any difficulty with ambulating,sitting, laying down or rolling side to side  Pt has no issues moving head neck or swallowing  No egg or soy allergy known to patient   No issues known to pt with past sedation  No FH of Malignant Hyperthermia  Pt is not on home 02   Pt is not on blood thinners   Pt has frequent issues with constipation RN instructed pt to use Miralax  per bottles instructions a week before prep days. Pt states they will  Pt is not on dialysis  Pt denise any abnormal heart rhythms   Pt denies any upcoming cardiac testing  Patient's chart reviewed by Norleen Schillings CNRA prior to pre-visit and patient appropriate for the [facility].  Pre-visit completed and red dot placed by patient's name on their procedure day (on provider's schedule).    Visit by phone  Pt states weight is 144 lb  Instructed not to smoke Marijuana day before and day of procedure   Pt given  both [facility] main # and MD on call # prior to instructions.  Informed pt to come in at the time discussed and is shown on PV instructions.  Pt instructed to use [employer] or Good[employer] for a price reduction on prep  Instructed pt where to find PV instructions in My Chart. . Instructed pt on all aspects of written instructions including med holds clothing to wear and foods to eat and not eat as well as after procedure legal restrictions and to call MD on call if needed.. Pt states understanding. Instructed pt to review instructions again prior to procedure and call main # given if has any questions or any issues. Pt states they will.

## 2024-05-03 ENCOUNTER — Encounter: Admitting: Gastroenterology

## 2024-06-07 ENCOUNTER — Encounter: Admitting: Gastroenterology

## 2024-06-07 ENCOUNTER — Telehealth: Payer: Self-pay | Admitting: Gastroenterology

## 2024-06-07 NOTE — Telephone Encounter (Signed)
 Hi Dr. San,  Called and spoke with patient.  He states his prior colon (12/25) was cancelled and he was to be called back when it was rescheduled.  Evidently, today's procedure was scheduled for him, but he was unaware of it.  He will call back to reschedule again.  Pt. Has [facility] authorization and will need a new authorization if procedure reschedule goes beyond 06/22/24.  Thank you.
